# Patient Record
Sex: Female | Born: 1990 | Hispanic: Yes | Marital: Single | Smoking: Never smoker
Health system: Southern US, Community
[De-identification: ages and names within clinical notes are randomized; demographics above are authoritative.]

## PROBLEM LIST (undated history)

## (undated) ENCOUNTER — Inpatient Hospital Stay (HOSPITAL_COMMUNITY): Payer: Self-pay

## (undated) DIAGNOSIS — O24419 Gestational diabetes mellitus in pregnancy, unspecified control: Secondary | ICD-10-CM

## (undated) DIAGNOSIS — IMO0002 Reserved for concepts with insufficient information to code with codable children: Secondary | ICD-10-CM

## (undated) DIAGNOSIS — R87629 Unspecified abnormal cytological findings in specimens from vagina: Secondary | ICD-10-CM

## (undated) DIAGNOSIS — R87619 Unspecified abnormal cytological findings in specimens from cervix uteri: Secondary | ICD-10-CM

## (undated) HISTORY — PX: NO PAST SURGERIES: SHX2092

## (undated) HISTORY — DX: Gestational diabetes mellitus in pregnancy, unspecified control: O24.419

---
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
## 2011-02-09 NOTE — L&D Delivery Note (Signed)
Delivery Note At 12:42 PM a viable female was delivered via  (Presentation: ;  ).  APGAR: , ; weight .   Placenta status: , .  Cord:  with the following complications: .  Cord pH: not done  Anesthesia:   Episiotomy:  Lacerations:  Suture Repair: 2.0 vicryl Est. Blood Loss (mL):   Mom to postpartum.  Baby to nursery-stable.  Elaine Meyer A 01/25/2012, 12:55 PM

## 2011-05-31 ENCOUNTER — Inpatient Hospital Stay (HOSPITAL_COMMUNITY)
Admission: AD | Admit: 2011-05-31 | Discharge: 2011-05-31 | Disposition: A | Discharge status: Home / Self Care | Payer: Self-pay | Source: Ambulatory Visit | Attending: Obstetrics and Gynecology | Admitting: Obstetrics and Gynecology

## 2011-05-31 ENCOUNTER — Encounter (HOSPITAL_COMMUNITY): Payer: Self-pay | Admitting: *Deleted

## 2011-05-31 ENCOUNTER — Inpatient Hospital Stay (HOSPITAL_COMMUNITY): Payer: Self-pay

## 2011-05-31 DIAGNOSIS — O99891 Other specified diseases and conditions complicating pregnancy: Secondary | ICD-10-CM | POA: Insufficient documentation

## 2011-05-31 DIAGNOSIS — R109 Unspecified abdominal pain: Secondary | ICD-10-CM | POA: Insufficient documentation

## 2011-05-31 DIAGNOSIS — O26899 Other specified pregnancy related conditions, unspecified trimester: Secondary | ICD-10-CM

## 2011-05-31 LAB — CBC
MCH: 31.3 pg (ref 26.0–34.0)
MCHC: 33.4 g/dL (ref 30.0–36.0)
MCV: 93.7 fL (ref 78.0–100.0)
Platelets: 190 10*3/uL (ref 150–400)
RDW: 13.1 % (ref 11.5–15.5)
WBC: 8.5 10*3/uL (ref 4.0–10.5)

## 2011-05-31 LAB — URINALYSIS, ROUTINE W REFLEX MICROSCOPIC
Bilirubin Urine: NEGATIVE
Nitrite: NEGATIVE
Protein, ur: NEGATIVE mg/dL
Specific Gravity, Urine: 1.015 (ref 1.005–1.030)
Urobilinogen, UA: 0.2 mg/dL (ref 0.0–1.0)

## 2011-05-31 LAB — DIFFERENTIAL
Basophils Absolute: 0.1 10*3/uL (ref 0.0–0.1)
Basophils Relative: 1 % (ref 0–1)
Eosinophils Absolute: 0.5 10*3/uL (ref 0.0–0.7)
Eosinophils Relative: 5 % (ref 0–5)
Lymphocytes Relative: 26 % (ref 12–46)

## 2011-05-31 LAB — WET PREP, GENITAL: Yeast Wet Prep HPF POC: NONE SEEN

## 2011-05-31 LAB — HCG, QUANTITATIVE, PREGNANCY: hCG, Beta Chain, Quant, S: 45228 m[IU]/mL — ABNORMAL HIGH (ref <5)

## 2011-05-31 NOTE — Discharge Instructions (Signed)
Dolor abdominal en el embarazo  (Abdominal Pain During Pregnancy)  El dolor de vientre (abdominal) es habitual durante el embarazo. Generalmente no se trata de un problema grave. Otras veces puede ser un signo de que algo no anda bien. Siempre comunquese con su mdico si tiene dolor abdominal. CUIDADOS EN EL HOGAR  Si el dolor es leve:   No tenga sexo (relaciones sexuales) ni se coloque nada dentro de la vagina hasta que se sienta mejor.   Haga reposo hasta que el dolor se calme. Si el dolor dura ms de 1 hora, comunquese con su mdico.   Si siente ganas de vomitar (nuseas ) beba lquidos claros.   No consuma alimentos slidos hasta que se sienta mejor.   Slo tome los medicamentos que le indic el mdico.   Cumpla con los controles mdicos segn las indicaciones.  SOLICITE AYUDA DE INMEDIATO SI:   Tiene un sangrado, pierde lquido o elimina trozos de tejido por la vagina.   Siente ms dolor o clicos.   Comienza avomitar.   Siente dolor al orinar u observa sangre en la orina.   Tiene fiebre.   No siente mover al beb.   Se siente muy dbil o cree que va a desmayarse.   Tiene dificultad para respirar con o sin dolor en el vientre.   Siente un dolor de cabeza muy intenso y Engineer, mining en el vientre.   Observa que sale un lquido por la vagina y siente dolor abdominal.   La materia fecal es lquida (diarrea).   El dolor en el vientre no desaparece, o empeora, luego de hacer reposo.  ASEGRESE DE QUE:   Comprende estas instrucciones.   Controlar su enfermedad.   Solicitar ayuda de inmediato si no mejora o si empeora.  Document Released: 10/07/2010 Document Revised: 01/14/2011 [facility] Patient Information 2012 [place], [place].

## 2011-05-31 NOTE — MAU Note (Signed)
G1; denies pain at present but states that she has had intermittent, sharp, cramping pain since her last menses;pain is in lower abdomen;

## 2011-05-31 NOTE — MAU Provider Note (Signed)
History     CSN: [id number]  Arrival date and time: 05/31/11 1042   First Provider Initiated Contact with Patient 05/31/11 1155      Chief Complaint  Patient presents with  . Possible Pregnancy   HPI Elaine Meyer is 21 y.o. G1P0 Unknown weeks presenting with report of sharp abdominal pain.  Is unsure of pregnancy, our test is positive.  LMP 04/11/11, normal cycle for her.   Planned pregnancy.  + nausea no vomiting.  Pain began with LMP, mostly in the lower abdomen but sometime in the epigastric area.  Moved to [facility], will get care there.   +_ for breast tenderness and frequent urination.    No past medical history on file.  No past surgical history on file.  Family History  Problem Relation Age of Onset  . Alcohol abuse Neg Hx   . Arthritis Neg Hx   . Asthma Neg Hx   . Birth defects Neg Hx   . Cancer Neg Hx   . COPD Neg Hx   . Depression Neg Hx   . Diabetes Neg Hx   . Drug abuse Neg Hx   . Early death Neg Hx   . Hearing loss Neg Hx   . Heart disease Neg Hx   . Hyperlipidemia Neg Hx   . Hypertension Neg Hx   . Kidney disease Neg Hx   . Learning disabilities Neg Hx   . Mental illness Neg Hx   . Mental retardation Neg Hx   . Miscarriages / Stillbirths Neg Hx   . Stroke Neg Hx   . Vision loss Neg Hx     History  Substance Use Topics  . Smoking status: Never Smoker   . Smokeless tobacco: Not on file  . Alcohol Use: No    Allergies: No Known Allergies  Prescriptions prior to admission  Medication Sig Dispense Refill  . prenatal vitamin w/FE, FA (NATACHEW) 29-1 MG CHEW Chew 1 tablet by mouth daily.        Review of Systems  Constitutional: Negative for fever and chills.  Gastrointestinal: Positive for nausea and abdominal pain. Negative for vomiting.  Genitourinary:       Negative for bleeding or vaginal discharge   Physical Exam   Blood pressure 119/66, pulse 77, temperature 97.8 F (36.6 C), temperature source Oral, resp. rate 16, height  5' 3.5" (1.613 m), weight 56.7 kg (125 lb), last menstrual period 04/11/2011.  Physical Exam  Constitutional: She is oriented to person, place, and time. She appears well-developed and well-nourished. No distress.  HENT:  Head: Normocephalic.  Neck: Normal range of motion.  Cardiovascular: Normal rate.   Respiratory: Effort normal.  GI: Soft. She exhibits no distension and no mass. There is no tenderness. There is no rebound and no guarding.  Genitourinary: Uterus is enlarged (slightly). Uterus is not tender. Right adnexum displays no mass, no tenderness and no fullness. Left adnexum displays no mass, no tenderness and no fullness. No bleeding around the vagina. No vaginal discharge found.  Neurological: She is alert and oriented to person, place, and time.  Skin: Skin is dry.  Psychiatric: She has a normal mood and affect. Her behavior is normal. Judgment and thought content normal.   Results for orders placed during the hospital encounter of 05/31/11 (from the past 24 hour(s))  URINALYSIS, ROUTINE W REFLEX MICROSCOPIC     Status: Normal   Collection Time   05/31/11 11:05 AM      Component Value Range  Color, Urine YELLOW  YELLOW    APPearance CLEAR  CLEAR    Specific Gravity, Urine 1.015  1.005 - 1.030    pH 7.0  5.0 - 8.0    Glucose, UA NEGATIVE  NEGATIVE (mg/dL)   Hgb urine dipstick NEGATIVE  NEGATIVE    Bilirubin Urine NEGATIVE  NEGATIVE    Ketones, ur NEGATIVE  NEGATIVE (mg/dL)   Protein, ur NEGATIVE  NEGATIVE (mg/dL)   Urobilinogen, UA 0.2  0.0 - 1.0 (mg/dL)   Nitrite NEGATIVE  NEGATIVE    Leukocytes, UA NEGATIVE  NEGATIVE   POCT PREGNANCY, URINE     Status: Abnormal   Collection Time   05/31/11 11:15 AM      Component Value Range   Preg Test, Ur POSITIVE (*) NEGATIVE   CBC     Status: Abnormal   Collection Time   05/31/11 11:59 AM      Component Value Range   WBC 8.5  4.0 - 10.5 (K/uL)   RBC 3.80 (*) 3.87 - 5.11 (MIL/uL)   Hemoglobin 11.9 (*) 12.0 - 15.0 (g/dL)   HCT  16.1 (*) 09.6 - 46.0 (%)   MCV 93.7  78.0 - 100.0 (fL)   MCH 31.3  26.0 - 34.0 (pg)   MCHC 33.4  30.0 - 36.0 (g/dL)   RDW 04.5  40.9 - 81.1 (%)   Platelets 190  150 - 400 (K/uL)  DIFFERENTIAL     Status: Normal   Collection Time   05/31/11 11:59 AM      Component Value Range   Neutrophils Relative 60  43 - 77 (%)   Neutro Abs 5.1  1.7 - 7.7 (K/uL)   Lymphocytes Relative 26  12 - 46 (%)   Lymphs Abs 2.2  0.7 - 4.0 (K/uL)   Monocytes Relative 8  3 - 12 (%)   Monocytes Absolute 0.7  0.1 - 1.0 (K/uL)   Eosinophils Relative 5  0 - 5 (%)   Eosinophils Absolute 0.5  0.0 - 0.7 (K/uL)   Basophils Relative 1  0 - 1 (%)   Basophils Absolute 0.1  0.0 - 0.1 (K/uL)  HCG, QUANTITATIVE, PREGNANCY     Status: Abnormal   Collection Time   05/31/11 11:59 AM      Component Value Range   hCG, Beta Chain, Quant, S 91478 (*) <5 (mIU/mL)  WET PREP, GENITAL     Status: Abnormal   Collection Time   05/31/11 12:30 PM      Component Value Range   Yeast Wet Prep HPF POC NONE SEEN  NONE SEEN    Trich, Wet Prep NONE SEEN  NONE SEEN    Clue Cells Wet Prep HPF POC FEW (*) NONE SEEN    WBC, Wet Prep HPF POC MODERATE (*) NONE SEEN   ULTRASOUND REPORT:  Single IUP [redacted]w[redacted]d.  FHR 136.  EDD 01/16/12   MAU Course  Procedures  GC/CHL culture to lab  MDM   Assessment and Plan  A:  Abdominal pain in early pregnancy      IUP at [redacted]w[redacted]d by ultrasound  P:  Keep plans to begin prenatal care with doctor in [facility]. [name],EVE M 05/31/2011, 11:56 AM

## 2011-05-31 NOTE — MAU Note (Signed)
2 preg tests a wk ago, both were positive.  Having abd pain, comes and goes, upper and lower.

## 2011-06-01 NOTE — MAU Provider Note (Signed)
Agree with above note.  Elaine Meyer 06/01/2011 6:55 AM

## 2011-10-04 LAB — OB RESULTS CONSOLE RUBELLA ANTIBODY, IGM: Rubella: IMMUNE

## 2012-01-22 ENCOUNTER — Encounter (HOSPITAL_COMMUNITY): Payer: Self-pay | Admitting: *Deleted

## 2012-01-22 ENCOUNTER — Inpatient Hospital Stay (HOSPITAL_COMMUNITY)
Admission: AD | Admit: 2012-01-22 | Discharge: 2012-01-22 | Disposition: A | Discharge status: Home / Self Care | Payer: Self-pay | Source: Ambulatory Visit | Attending: Obstetrics | Admitting: Obstetrics

## 2012-01-22 DIAGNOSIS — O99891 Other specified diseases and conditions complicating pregnancy: Secondary | ICD-10-CM | POA: Insufficient documentation

## 2012-01-22 DIAGNOSIS — O479 False labor, unspecified: Secondary | ICD-10-CM

## 2012-01-22 DIAGNOSIS — N949 Unspecified condition associated with female genital organs and menstrual cycle: Secondary | ICD-10-CM | POA: Insufficient documentation

## 2012-01-22 HISTORY — DX: Unspecified abnormal cytological findings in specimens from cervix uteri: R87.619

## 2012-01-22 HISTORY — DX: Reserved for concepts with insufficient information to code with codable children: IMO0002

## 2012-01-22 LAB — AMNISURE RUPTURE OF MEMBRANE (ROM) NOT AT ARMC: Amnisure ROM: NEGATIVE

## 2012-01-22 NOTE — MAU Provider Note (Signed)
HPI: Elaine Meyer is a 21 y.o. year old G1P0 female at [redacted]w[redacted]d weeks gestation who presents to MAU reporting leaking small amount of clear fluid mixed w/ brow mucus and cramping. Pos FM.  FHR 120, category I Toco: Q 5-10 min, mild  Pelvic: Pool equivocal. Small amount of clear fluid mixed w/ moderate amount of tan mucus. Dilation: 1 Effacement (%): 70 Cervical Position: Posterior Station: -3 Presentation: Vertex Exam by:: [place] CNM   Recent Results (from the past 168 hour(s))  AMNISURE RUPTURE OF MEMBRANE (ROM)   Collection Time   01/22/12  3:15 PM      Component Value Range   Amnisure ROM NEGATIVE     Assessment: Intact membranes  Plan: D/C home Labor precautions and FKCs F/U w/ Dr. Gaynell Face as scheduled.  Dorathy Kinsman, CNM

## 2012-01-22 NOTE — MAU Note (Signed)
Started with pinkish, thick discharge, then became watery.  No ctx's, no obvious bleeding.

## 2012-01-22 NOTE — MAU Note (Signed)
Did not wear pad in, underwear dry

## 2012-01-24 ENCOUNTER — Encounter (HOSPITAL_COMMUNITY): Payer: Self-pay

## 2012-01-24 ENCOUNTER — Inpatient Hospital Stay (HOSPITAL_COMMUNITY)
Admission: RE | Admit: 2012-01-24 | Discharge: 2012-01-27 | DRG: 775 | Disposition: A | Discharge status: Home / Self Care | Payer: Medicaid Other | Source: Ambulatory Visit | Attending: Obstetrics | Admitting: Obstetrics

## 2012-01-24 LAB — TYPE AND SCREEN
ABO/RH(D): O POS
Antibody Screen: NEGATIVE

## 2012-01-24 LAB — CBC
MCHC: 32.3 g/dL (ref 30.0–36.0)
RDW: 16.1 % — ABNORMAL HIGH (ref 11.5–15.5)

## 2012-01-24 LAB — ABO/RH: ABO/RH(D): O POS

## 2012-01-24 MED ORDER — ZOLPIDEM TARTRATE 5 MG PO TABS
5.0000 mg | ORAL_TABLET | Freq: Every evening | ORAL | Status: DC | PRN
  Administered 2012-01-24: 5 mg via ORAL
  Filled 2012-01-24: qty 1

## 2012-01-24 MED ORDER — IBUPROFEN 600 MG PO TABS
600.0000 mg | ORAL_TABLET | Freq: Four times a day (QID) | ORAL | Status: DC | PRN
  Filled 2012-01-24 (×4): qty 1

## 2012-01-24 MED ORDER — ACETAMINOPHEN 325 MG PO TABS: 650.0000 mg | ORAL_TABLET | ORAL | Status: DC | PRN

## 2012-01-24 MED ORDER — OXYTOCIN 40 UNITS IN LACTATED RINGERS INFUSION - SIMPLE MED
62.5000 mL/h | INTRAVENOUS | Status: DC
  Filled 2012-01-24: qty 1000

## 2012-01-24 MED ORDER — TERBUTALINE SULFATE 1 MG/ML IJ SOLN: 0.2500 mg | Freq: Once | INTRAMUSCULAR | Status: AC | PRN

## 2012-01-24 MED ORDER — OXYTOCIN 40 UNITS IN LACTATED RINGERS INFUSION - SIMPLE MED: 1.0000 m[IU]/min | INTRAVENOUS | Status: DC

## 2012-01-24 MED ORDER — OXYCODONE-ACETAMINOPHEN 5-325 MG PO TABS
1.0000 | ORAL_TABLET | ORAL | Status: DC | PRN
  Filled 2012-01-24: qty 1

## 2012-01-24 MED ORDER — FLEET ENEMA 7-19 GM/118ML RE ENEM: 1.0000 | ENEMA | RECTAL | Status: DC | PRN

## 2012-01-24 MED ORDER — ONDANSETRON HCL 4 MG/2ML IJ SOLN: 4.0000 mg | Freq: Four times a day (QID) | INTRAMUSCULAR | Status: DC | PRN

## 2012-01-24 MED ORDER — OXYTOCIN BOLUS FROM INFUSION: 500.0000 mL | INTRAVENOUS | Status: DC

## 2012-01-24 MED ORDER — LIDOCAINE HCL (PF) 1 % IJ SOLN: 30.0000 mL | INTRAMUSCULAR | Status: DC | PRN

## 2012-01-24 MED ORDER — CITRIC ACID-SODIUM CITRATE 334-500 MG/5ML PO SOLN: 30.0000 mL | ORAL | Status: DC | PRN

## 2012-01-24 MED ORDER — LACTATED RINGERS IV SOLN
INTRAVENOUS | Status: DC
  Administered 2012-01-24 – 2012-01-25 (×2): via INTRAVENOUS

## 2012-01-24 MED ORDER — LACTATED RINGERS IV SOLN
500.0000 mL | INTRAVENOUS | Status: DC | PRN
  Administered 2012-01-25: 500 mL via INTRAVENOUS

## 2012-01-25 ENCOUNTER — Inpatient Hospital Stay (HOSPITAL_COMMUNITY): Payer: Medicaid Other | Admitting: Anesthesiology

## 2012-01-25 ENCOUNTER — Encounter (HOSPITAL_COMMUNITY): Payer: Self-pay | Admitting: Anesthesiology

## 2012-01-25 ENCOUNTER — Encounter (HOSPITAL_COMMUNITY): Payer: Self-pay

## 2012-01-25 LAB — RPR: RPR Ser Ql: NONREACTIVE

## 2012-01-25 MED ORDER — PRENATAL MULTIVITAMIN CH
1.0000 | ORAL_TABLET | Freq: Every day | ORAL | Status: DC
  Administered 2012-01-26 – 2012-01-27 (×2): 1 via ORAL
  Filled 2012-01-25 (×2): qty 1

## 2012-01-25 MED ORDER — IBUPROFEN 600 MG PO TABS
600.0000 mg | ORAL_TABLET | Freq: Four times a day (QID) | ORAL | Status: DC
  Administered 2012-01-25 – 2012-01-26 (×4): 600 mg via ORAL
  Filled 2012-01-25 (×2): qty 1

## 2012-01-25 MED ORDER — OXYCODONE-ACETAMINOPHEN 5-325 MG PO TABS
1.0000 | ORAL_TABLET | ORAL | Status: DC | PRN
  Administered 2012-01-25: 1 via ORAL

## 2012-01-25 MED ORDER — ONDANSETRON HCL 4 MG PO TABS: 4.0000 mg | ORAL_TABLET | ORAL | Status: DC | PRN

## 2012-01-25 MED ORDER — ZOLPIDEM TARTRATE 5 MG PO TABS: 5.0000 mg | ORAL_TABLET | Freq: Every evening | ORAL | Status: DC | PRN

## 2012-01-25 MED ORDER — LANOLIN HYDROUS EX OINT: TOPICAL_OINTMENT | CUTANEOUS | Status: DC | PRN

## 2012-01-25 MED ORDER — WITCH HAZEL-GLYCERIN EX PADS: 1.0000 "application " | MEDICATED_PAD | CUTANEOUS | Status: DC | PRN

## 2012-01-25 MED ORDER — DIPHENHYDRAMINE HCL 50 MG/ML IJ SOLN: 12.5000 mg | INTRAMUSCULAR | Status: DC | PRN

## 2012-01-25 MED ORDER — SENNOSIDES-DOCUSATE SODIUM 8.6-50 MG PO TABS
2.0000 | ORAL_TABLET | Freq: Every day | ORAL | Status: DC
  Administered 2012-01-25 – 2012-01-26 (×2): 2 via ORAL

## 2012-01-25 MED ORDER — ONDANSETRON HCL 4 MG/2ML IJ SOLN: 4.0000 mg | INTRAMUSCULAR | Status: DC | PRN

## 2012-01-25 MED ORDER — PHENYLEPHRINE 40 MCG/ML (10ML) SYRINGE FOR IV PUSH (FOR BLOOD PRESSURE SUPPORT)
80.0000 ug | PREFILLED_SYRINGE | INTRAVENOUS | Status: DC | PRN
  Filled 2012-01-25: qty 5

## 2012-01-25 MED ORDER — DIBUCAINE 1 % RE OINT: 1.0000 "application " | TOPICAL_OINTMENT | RECTAL | Status: DC | PRN

## 2012-01-25 MED ORDER — FERROUS SULFATE 325 (65 FE) MG PO TABS
325.0000 mg | ORAL_TABLET | Freq: Two times a day (BID) | ORAL | Status: DC
  Administered 2012-01-26 – 2012-01-27 (×3): 325 mg via ORAL
  Filled 2012-01-25 (×3): qty 1

## 2012-01-25 MED ORDER — BENZOCAINE-MENTHOL 20-0.5 % EX AERO: 1.0000 "application " | INHALATION_SPRAY | CUTANEOUS | Status: DC | PRN

## 2012-01-25 MED ORDER — LACTATED RINGERS IV SOLN
500.0000 mL | Freq: Once | INTRAVENOUS | Status: AC
  Administered 2012-01-25: 500 mL via INTRAVENOUS

## 2012-01-25 MED ORDER — DIPHENHYDRAMINE HCL 25 MG PO CAPS: 25.0000 mg | ORAL_CAPSULE | Freq: Four times a day (QID) | ORAL | Status: DC | PRN

## 2012-01-25 MED ORDER — BUTORPHANOL TARTRATE 1 MG/ML IJ SOLN
2.0000 mg | INTRAMUSCULAR | Status: DC | PRN
  Administered 2012-01-25: 2 mg via INTRAVENOUS
  Filled 2012-01-25: qty 2

## 2012-01-25 MED ORDER — PHENYLEPHRINE 40 MCG/ML (10ML) SYRINGE FOR IV PUSH (FOR BLOOD PRESSURE SUPPORT): 80.0000 ug | PREFILLED_SYRINGE | INTRAVENOUS | Status: DC | PRN

## 2012-01-25 MED ORDER — SIMETHICONE 80 MG PO CHEW: 80.0000 mg | CHEWABLE_TABLET | ORAL | Status: DC | PRN

## 2012-01-25 MED ORDER — FENTANYL 2.5 MCG/ML BUPIVACAINE 1/10 % EPIDURAL INFUSION (WH - ANES)
14.0000 mL/h | INTRAMUSCULAR | Status: DC
  Administered 2012-01-25 (×2): 14 mL/h via EPIDURAL
  Filled 2012-01-25 (×2): qty 125

## 2012-01-25 MED ORDER — LIDOCAINE HCL (PF) 1 % IJ SOLN
INTRAMUSCULAR | Status: DC | PRN
  Administered 2012-01-25: 2 mL
  Administered 2012-01-25 (×3): 4 mL

## 2012-01-25 MED ORDER — TETANUS-DIPHTH-ACELL PERTUSSIS 5-2.5-18.5 LF-MCG/0.5 IM SUSP
0.5000 mL | Freq: Once | INTRAMUSCULAR | Status: AC
  Administered 2012-01-26: 0.5 mL via INTRAMUSCULAR

## 2012-01-25 MED ORDER — EPHEDRINE 5 MG/ML INJ: 10.0000 mg | INTRAVENOUS | Status: DC | PRN

## 2012-01-25 MED ORDER — EPHEDRINE 5 MG/ML INJ
10.0000 mg | INTRAVENOUS | Status: DC | PRN
  Filled 2012-01-25: qty 4

## 2012-01-25 NOTE — H&P (Signed)
This is Dr. Francoise Ceo dictating the history and physical on  Elaine Meyer she's a 21 year old gravida 1 at 1 weeks and 2 days [facility] 01/16/2012 was admitted for induction negative GBS her cervix is 2 cm 90% the vertex 0 station him she was started on low-dose Pitocin and she is now 9 cm 100% vertex -2 forewaters were ruptured fluids clear Past medical history negative Past surgical history negative Social history noncontributory System review negative Physical exam revealed a well-developed female in labor HEENT negative Lungs clear to P&A Breasts engorged Heart regular rhythm no murmurs no gallops Abdomen term Pelvic as described above Extremities negative and

## 2012-01-25 NOTE — Anesthesia Procedure Notes (Signed)
Epidural Patient location during procedure: OB Start time: 01/25/2012 4:35 AM  Staffing Performed by: anesthesiologist   Preanesthetic Checklist Completed: patient identified, site marked, surgical consent, pre-op evaluation, timeout performed, IV checked, risks and benefits discussed and monitors and equipment checked  Epidural Patient position: sitting Prep: site prepped and draped and DuraPrep Patient monitoring: continuous pulse ox and blood pressure Approach: midline Injection technique: LOR air  Needle:  Needle type: Tuohy  Needle gauge: 17 G Needle length: 9 cm and 9 Needle insertion depth: 5 cm cm Catheter type: closed end flexible Catheter size: 19 Gauge Catheter at skin depth: 10 cm Test dose: negative  Assessment Events: blood not aspirated, injection not painful, no injection resistance, negative IV test and no paresthesia  Additional Notes Discussed risk of headache, infection, bleeding, nerve injury and failed or incomplete block.  Patient voices understanding and wishes to proceed.  Epidural placed easily on first attempt.  Patient tolerated procedure well with no apparent complications.  Jasmine December, MD Reason for block:procedure for pain

## 2012-01-25 NOTE — Anesthesia Preprocedure Evaluation (Signed)
Anesthesia Evaluation  Patient identified by MRN, date of birth, ID band Patient awake    Reviewed: Allergy & Precautions, H&P , NPO status , Patient's Chart, lab work & pertinent test results, reviewed documented beta blocker date and time   History of Anesthesia Complications Negative for: history of anesthetic complications  Airway Mallampati: I TM Distance: >3 FB Neck ROM: full    Dental  (+) Teeth Intact   Pulmonary neg pulmonary ROS,  breath sounds clear to auscultation        Cardiovascular negative cardio ROS  Rhythm:regular Rate:Normal     Neuro/Psych negative neurological ROS  negative psych ROS   GI/Hepatic negative GI ROS, Neg liver ROS,   Endo/Other  negative endocrine ROS  Renal/GU negative Renal ROS     Musculoskeletal   Abdominal   Peds  Hematology  (+) Blood dyscrasia (hgb 9.8), anemia ,   Anesthesia Other Findings   Reproductive/Obstetrics (+) Pregnancy                           Anesthesia Physical  Anesthesia Plan  ASA: II  Anesthesia Plan: Epidural   Post-op Pain Management:    Induction:   Airway Management Planned:   Additional Equipment:   Intra-op Plan:   Post-operative Plan:   Informed Consent: I have reviewed the patients History and Physical, chart, labs and discussed the procedure including the risks, benefits and alternatives for the proposed anesthesia with the patient or authorized representative who has indicated his/her understanding and acceptance.     Plan Discussed with:   Anesthesia Plan Comments:         Anesthesia Quick Evaluation  

## 2012-01-25 NOTE — Progress Notes (Signed)
Patient ID: Elaine Meyer, female   DOB: [birth year], 21 y.o.   MRN: [id number]  now fully dilated plus one station reactive tracing

## 2012-01-26 LAB — CBC
MCH: 26.2 pg (ref 26.0–34.0)
MCHC: 31.5 g/dL (ref 30.0–36.0)
MCV: 83.2 fL (ref 78.0–100.0)
Platelets: 171 10*3/uL (ref 150–400)
RBC: 3.09 MIL/uL — ABNORMAL LOW (ref 3.87–5.11)
RDW: 16 % — ABNORMAL HIGH (ref 11.5–15.5)

## 2012-01-26 MED ORDER — INFLUENZA VIRUS VACC SPLIT PF IM SUSP
0.5000 mL | Freq: Once | INTRAMUSCULAR | Status: AC
  Administered 2012-01-26: 0.5 mL via INTRAMUSCULAR
  Filled 2012-01-26: qty 0.5

## 2012-01-26 NOTE — Progress Notes (Signed)
Patient ID: Elaine Meyer, female   DOB: November 17, 1990, 21 y.o.   MRN: [id number] Postpartum day one  Vital signs normal Fundus firm Lochia moderate Legs negative Doing well

## 2012-01-26 NOTE — Progress Notes (Signed)
UR chart review completed.  

## 2012-01-27 NOTE — Discharge Summary (Signed)
Obstetric Discharge Summary Reason for Admission: induction of labor Prenatal Procedures: none Intrapartum Procedures: spontaneous vaginal delivery Postpartum Procedures: none Complications-Operative and Postpartum: none Hemoglobin  Date Value Range Status  01/26/2012 8.1* 12.0 - 15.0 g/dL Final     DELTA CHECK NOTED     REPEATED TO VERIFY     HCT  Date Value Range Status  01/26/2012 25.7* 36.0 - 46.0 % Final    Physical Exam:  General: alert Lochia: appropriate Uterine Fundus: firm Incision: healing well DVT Evaluation: No evidence of DVT seen on physical exam.  Discharge Diagnoses: Term Pregnancy-delivered  Discharge Information: Date: 01/27/2012 Activity: pelvic rest Diet: routine Medications: Percocet Condition: stable Instructions: refer to practice specific booklet Discharge to: home Follow-up Information    Call in 6 weeks to follow up.         Newborn Data: Live born female  Birth Weight: 7 lb 11.5 oz (3500 g) APGAR: 9, 9  Home with mother.  Elaine Meyer A 01/27/2012, 6:21 AM

## 2012-01-29 NOTE — Anesthesia Postprocedure Evaluation (Signed)
Patient stable following vaginal delivery.  

## 2012-03-28 LAB — HM PAP SMEAR: HM Pap smear: POSITIVE

## 2012-05-16 ENCOUNTER — Emergency Department (HOSPITAL_COMMUNITY)
Admission: EM | Admit: 2012-05-16 | Discharge: 2012-05-17 | Disposition: A | Discharge status: Home / Self Care | Payer: Self-pay | Attending: Emergency Medicine | Admitting: Emergency Medicine

## 2012-05-16 ENCOUNTER — Encounter (HOSPITAL_COMMUNITY): Payer: Self-pay | Admitting: Emergency Medicine

## 2012-05-16 DIAGNOSIS — R509 Fever, unspecified: Secondary | ICD-10-CM | POA: Insufficient documentation

## 2012-05-16 DIAGNOSIS — Z8619 Personal history of other infectious and parasitic diseases: Secondary | ICD-10-CM | POA: Insufficient documentation

## 2012-05-16 DIAGNOSIS — R5381 Other malaise: Secondary | ICD-10-CM | POA: Insufficient documentation

## 2012-05-16 DIAGNOSIS — R5383 Other fatigue: Secondary | ICD-10-CM | POA: Insufficient documentation

## 2012-05-16 DIAGNOSIS — N61 Mastitis without abscess: Secondary | ICD-10-CM | POA: Insufficient documentation

## 2012-05-16 NOTE — ED Notes (Signed)
This nurse present for physical exam and left breast ultrasound performed by Theron Arista, [place]

## 2012-05-16 NOTE — ED Notes (Addendum)
Pt c/o fever and body aches x2 days. Denies NVD, SOB, cough. Pt c/o left breast tenderness. Pt reports upon pumping today, pt noticed left breast milk was pink in color. Pt denies issues with right breast. Left breast is hardened and tender on palpation.

## 2012-05-16 NOTE — ED Notes (Signed)
PT. REPORTS FEVER 2 DAYS AGO WITH BODY ACHES ALSO REPORTS " PINK COLORED  BREAST MILK" YESTERDAY WITH LEFT BREAST SORENESS.

## 2012-05-17 MED ORDER — CEPHALEXIN 500 MG PO CAPS: 500.0000 mg | ORAL_CAPSULE | Freq: Four times a day (QID) | ORAL | Status: DC

## 2012-05-17 NOTE — ED Notes (Signed)
Pt comfortable with discharge and follow-up instructions. Given prescriptions x1

## 2012-05-17 NOTE — ED Provider Notes (Signed)
History     CSN: [id number]  Arrival date & time 05/16/12  2203   First MD Initiated Contact with Patient 05/16/12 2331      Chief Complaint  Patient presents with  . Fever  . Breast Problem   HPI  History provided by the patient and significant other. Patient is a 22 year old female currently breast-feeding her 61-month-old daughter presenting with complaints of pain, redness and pink milky discharge from left breast and nipple. Symptoms first began with some redness and tenderness to the outside of the left breast 2 days ago. Area has become more red and tender since that time. Today patient noticed pink milky discharge from the left nipple the breasts. She also reports some subjective chills and fatigue with slight fevers at home. She is not use any medications or treatment for symptoms. Denies having similar symptoms previously. No other aggravating or alleviating factors. No other associated symptoms.    Past Medical History  Diagnosis Date  . Abnormal Pap smear     HPV    Past Surgical History  Procedure Laterality Date  . No past surgeries      Family History  Problem Relation Age of Onset  . Alcohol abuse Neg Hx   . Arthritis Neg Hx   . Asthma Neg Hx   . Birth defects Neg Hx   . Cancer Neg Hx   . COPD Neg Hx   . Depression Neg Hx   . Diabetes Neg Hx   . Drug abuse Neg Hx   . Early death Neg Hx   . Hearing loss Neg Hx   . Heart disease Neg Hx   . Hyperlipidemia Neg Hx   . Hypertension Neg Hx   . Kidney disease Neg Hx   . Learning disabilities Neg Hx   . Mental illness Neg Hx   . Mental retardation Neg Hx   . Miscarriages / Stillbirths Neg Hx   . Stroke Neg Hx   . Vision loss Neg Hx   . Other Neg Hx     History  Substance Use Topics  . Smoking status: Never Smoker   . Smokeless tobacco: Never Used  . Alcohol Use: No    OB History   Grav Para Term Preterm Abortions TAB SAB Ect Mult Living   1 1 1       1       Review of Systems  Constitutional:  Positive for chills and fatigue.  Skin:       Breast redness and pain  All other systems reviewed and are negative.    Allergies  Review of patient's allergies indicates no known allergies.  Home Medications  No current outpatient prescriptions on file.  BP 127/52  Pulse 83  Temp(Src) 98 F (36.7 C) (Oral)  Resp 14  SpO2 98%  LMP 05/01/2012  Physical Exam  Nursing note and vitals reviewed. Constitutional: She is oriented to person, place, and time. She appears well-developed and well-nourished. No distress.  HENT:  Head: Normocephalic.  Cardiovascular: Normal rate and regular rhythm.   Pulmonary/Chest: Effort normal and breath sounds normal. No respiratory distress.  Mild erythema and tenderness to left inferolateral breast.  Small amount of milky discharge from nipple without obvious blood. Bedside ultrasound performed without any signs of superficial abscess  Abdominal: Soft.  Neurological: She is alert and oriented to person, place, and time.  Skin: Skin is warm and dry. No rash noted.  Psychiatric: She has a normal mood and affect. Her behavior  is normal.    ED Course  Procedures    1. Mastitis, left, acute       MDM  11:50PM patient seen and evaluated. Patient well-appearing in no acute distress. Patient does not appear severely ill or toxic. She is afebrile at triage. No prior medications.        Angus Seller, PA-C 05/17/12 0011

## 2012-05-18 NOTE — ED Provider Notes (Signed)
Medical screening examination/treatment/procedure(s) were performed by non-physician practitioner and as supervising physician I was immediately available for consultation/collaboration.  Jones Skene, M.D.     Jones Skene, MD 05/18/12 [phone]

## 2013-02-08 NOTE — L&D Delivery Note (Signed)
Delivery Note At 4:25 AM a viable female was delivered via Vaginal, Spontaneous Delivery (Presentation: Left Occiput Anterior).  APGAR: 7, 9; weight pending.   Placenta status: Intact, Spontaneous.  Cord:  3v with the following complications: none.  Cord pH:   Anesthesia: Epidural  Episiotomy: None Lacerations: None Suture Repair: none Est. Blood Loss (mL): 150  Mom to postpartum.  Baby to Couplet care / Skin to Skin.  Tawni CarnesWight, Andrew 11/11/2013, 4:44 AM     I was gloved and present for delivery in its entirety.  Second stage of labor progressed, baby delivered after few contractions.  no decels during second stage noted.  Complications: none  Lacerations: none  EBL: 150  Jeramie Scogin [name], MD 8:53 AM

## 2013-05-21 ENCOUNTER — Other Ambulatory Visit (HOSPITAL_COMMUNITY): Payer: Self-pay | Admitting: Physician Assistant

## 2013-05-21 DIAGNOSIS — Z3689 Encounter for other specified antenatal screening: Secondary | ICD-10-CM

## 2013-05-21 LAB — OB RESULTS CONSOLE ABO/RH: RH Type: POSITIVE

## 2013-05-21 LAB — OB RESULTS CONSOLE ANTIBODY SCREEN: Antibody Screen: NEGATIVE

## 2013-05-21 LAB — OB RESULTS CONSOLE HIV ANTIBODY (ROUTINE TESTING): HIV: NONREACTIVE

## 2013-05-21 LAB — OB RESULTS CONSOLE HEPATITIS B SURFACE ANTIGEN: Hepatitis B Surface Ag: NEGATIVE

## 2013-05-21 LAB — OB RESULTS CONSOLE RUBELLA ANTIBODY, IGM: RUBELLA: IMMUNE

## 2013-05-21 LAB — OB RESULTS CONSOLE GC/CHLAMYDIA
Chlamydia: NEGATIVE
Gonorrhea: NEGATIVE

## 2013-05-21 LAB — OB RESULTS CONSOLE RPR: RPR: NONREACTIVE

## 2013-05-27 ENCOUNTER — Inpatient Hospital Stay (HOSPITAL_COMMUNITY)
Admission: AD | Admit: 2013-05-27 | Discharge: 2013-05-28 | Disposition: A | Discharge status: Home / Self Care | Payer: Medicaid Other | Source: Ambulatory Visit | Attending: Obstetrics & Gynecology | Admitting: Obstetrics & Gynecology

## 2013-05-27 ENCOUNTER — Encounter (HOSPITAL_COMMUNITY): Payer: Self-pay | Admitting: *Deleted

## 2013-05-27 ENCOUNTER — Inpatient Hospital Stay (HOSPITAL_COMMUNITY): Payer: Medicaid Other

## 2013-05-27 DIAGNOSIS — O208 Other hemorrhage in early pregnancy: Secondary | ICD-10-CM | POA: Diagnosis not present

## 2013-05-27 DIAGNOSIS — O209 Hemorrhage in early pregnancy, unspecified: Secondary | ICD-10-CM | POA: Diagnosis present

## 2013-05-27 DIAGNOSIS — O4690 Antepartum hemorrhage, unspecified, unspecified trimester: Secondary | ICD-10-CM

## 2013-05-27 HISTORY — DX: Unspecified abnormal cytological findings in specimens from vagina: R87.629

## 2013-05-27 LAB — URINALYSIS, ROUTINE W REFLEX MICROSCOPIC
Bilirubin Urine: NEGATIVE
GLUCOSE, UA: NEGATIVE mg/dL
Ketones, ur: 15 mg/dL — AB
Leukocytes, UA: NEGATIVE
Nitrite: NEGATIVE
PH: 6 (ref 5.0–8.0)
Protein, ur: NEGATIVE mg/dL
SPECIFIC GRAVITY, URINE: 1.025 (ref 1.005–1.030)
Urobilinogen, UA: 0.2 mg/dL (ref 0.0–1.0)

## 2013-05-27 LAB — URINE MICROSCOPIC-ADD ON

## 2013-05-27 LAB — CBC
HEMATOCRIT: 31.7 % — AB (ref 36.0–46.0)
Hemoglobin: 10.8 g/dL — ABNORMAL LOW (ref 12.0–15.0)
MCH: 30.6 pg (ref 26.0–34.0)
MCHC: 34.1 g/dL (ref 30.0–36.0)
MCV: 89.8 fL (ref 78.0–100.0)
Platelets: 196 10*3/uL (ref 150–400)
RBC: 3.53 MIL/uL — ABNORMAL LOW (ref 3.87–5.11)
RDW: 14.2 % (ref 11.5–15.5)
WBC: 9.8 10*3/uL (ref 4.0–10.5)

## 2013-05-27 NOTE — Progress Notes (Signed)
Patient informed to keep appointment for anatomy/dating US tomorrow, 05/28/13 at 2:00 pm.

## 2013-05-27 NOTE — MAU Provider Note (Signed)
History     CSN: [id number]  Arrival date and time: 05/27/13 2145   First Provider Initiated Contact with Patient 05/27/13 2225      Chief Complaint  Patient presents with  . Vaginal Bleeding   Vaginal Bleeding Pertinent negatives include no abdominal pain.    Pt is a 23 yo G2P1001 at 10278w3d wks IUP here with report of blood with wiping x 3 today. No report of abdominal pain or cramping.  Denies intercourse in past 24 hours.  Negative UTI symptoms.  [facility]  Anatomy scan scheduled for tomorrow.    Past Medical History  Diagnosis Date  . Abnormal Pap smear     HPV  . Vaginal Pap smear, abnormal     Past Surgical History  Procedure Laterality Date  . No past surgeries      Family History  Problem Relation Age of Onset  . Alcohol abuse Neg Hx   . Arthritis Neg Hx   . Asthma Neg Hx   . Birth defects Neg Hx   . Cancer Neg Hx   . COPD Neg Hx   . Depression Neg Hx   . Diabetes Neg Hx   . Drug abuse Neg Hx   . Early death Neg Hx   . Hearing loss Neg Hx   . Heart disease Neg Hx   . Hyperlipidemia Neg Hx   . Hypertension Neg Hx   . Kidney disease Neg Hx   . Learning disabilities Neg Hx   . Mental illness Neg Hx   . Mental retardation Neg Hx   . Miscarriages / Stillbirths Neg Hx   . Stroke Neg Hx   . Vision loss Neg Hx   . Other Neg Hx     History  Substance Use Topics  . Smoking status: Never Smoker   . Smokeless tobacco: Never Used  . Alcohol Use: No    Allergies: No Known Allergies  Prescriptions prior to admission  Medication Sig Dispense Refill  . Prenatal Vit-Fe Fumarate-FA (PRENATAL MULTIVITAMIN) TABS tablet Take 1 tablet by mouth daily at 12 noon.        Review of Systems  Gastrointestinal: Negative for abdominal pain.  Genitourinary: Positive for vaginal bleeding.       Vaginal bleeding  All other systems reviewed and are negative.  Physical Exam   Blood pressure 104/74, pulse 98,  temperature 98.7 F (37.1 C), temperature source Oral, resp. rate 18, height 5\' 3"  (1.6 m), weight 59.138 kg (130 lb 6 oz), SpO2 100.00%.  Physical Exam  Constitutional: She is oriented to person, place, and time. She appears well-developed and well-nourished. No distress.  HENT:  Head: Normocephalic.  Neck: Normal range of motion. Neck supple.  Cardiovascular: Normal rate, regular rhythm and normal heart sounds.   Respiratory: Effort normal and breath sounds normal.  GI: Soft. There is no tenderness.  Genitourinary: There is bleeding (moderate) around the vagina.  Musculoskeletal: Normal range of motion. She exhibits no edema.  Neurological: She is alert and oriented to person, place, and time.  Skin: Skin is warm and dry.    MAU Course  Procedures  Results for orders placed during the hospital encounter of 05/27/13 (from the past 24 hour(s))  URINALYSIS, ROUTINE W REFLEX MICROSCOPIC     Status: Abnormal   Collection Time    05/27/13 10:00 PM      Result Value Ref Range   Color, Urine YELLOW  YELLOW   APPearance  CLEAR  CLEAR   Specific Gravity, Urine 1.025  1.005 - 1.030   pH 6.0  5.0 - 8.0   Glucose, UA NEGATIVE  NEGATIVE mg/dL   Hgb urine dipstick LARGE (*) NEGATIVE   Bilirubin Urine NEGATIVE  NEGATIVE   Ketones, ur 15 (*) NEGATIVE mg/dL   Protein, ur NEGATIVE  NEGATIVE mg/dL   Urobilinogen, UA 0.2  0.0 - 1.0 mg/dL   Nitrite NEGATIVE  NEGATIVE   Leukocytes, UA NEGATIVE  NEGATIVE  URINE MICROSCOPIC-ADD ON     Status: Abnormal   Collection Time    05/27/13 10:00 PM      Result Value Ref Range   Squamous Epithelial / LPF FEW (*) RARE   WBC, UA 0-2  <3 WBC/hpf   RBC / HPF 3-6  <3 RBC/hpf   Bacteria, UA FEW (*) RARE   Urine-Other MUCOUS PRESENT    CBC     Status: Abnormal   Collection Time    05/27/13 10:35 PM      Result Value Ref Range   WBC 9.8  4.0 - 10.5 K/uL   RBC 3.53 (*) 3.87 - 5.11 MIL/uL   Hemoglobin 10.8 (*) 12.0 - 15.0 g/dL   HCT 16.131.7 (*) 09.636.0 - 04.546.0 %    MCV 89.8  78.0 - 100.0 fL   MCH 30.6  26.0 - 34.0 pg   MCHC 34.1  30.0 - 36.0 g/dL   RDW 40.914.2  81.111.5 - 91.415.5 %   Platelets 196  150 - 400 K/uL   Ultrasound: Anterior, above cervical os placent Subchorionic hemorrhage (5.7 x 1.0 x 1.7 cm), cervical length 3.8 cm  Assessment and Plan  22 yo G2P1001 at 2267w4d wks IUP Subchorionic Hemorrhage  Plan: Discharge to home RX [employer] Bleeding precautions Keep scheduled appointment for anatomy scan tomorrow. Consulted with Dr. Erin FullingHarraway-Smith > agrees with plan of care  Melissa NoonWalidah N Muhammad, CNM 05/28/2013 0100

## 2013-05-27 NOTE — MAU Note (Signed)
Pt reports just becoming aware of fetal movement, denies any pain.  [facility] and has her ultrasound tomorrow.

## 2013-05-27 NOTE — MAU Note (Signed)
Patient states she saw some vaginal bleeding while using the bathroom earlier this afternoon and then again prior to coming to the hospital. Denies pain, LOF and vaginal discharge.

## 2013-05-28 ENCOUNTER — Ambulatory Visit (HOSPITAL_COMMUNITY)
Admission: RE | Admit: 2013-05-28 | Discharge: 2013-05-28 | Disposition: A | Discharge status: Home / Self Care | Payer: Medicaid Other | Source: Ambulatory Visit | Attending: Physician Assistant | Admitting: Physician Assistant

## 2013-05-28 DIAGNOSIS — Z3689 Encounter for other specified antenatal screening: Secondary | ICD-10-CM | POA: Diagnosis present

## 2013-05-28 DIAGNOSIS — O4690 Antepartum hemorrhage, unspecified, unspecified trimester: Secondary | ICD-10-CM

## 2013-05-28 MED ORDER — INTEGRA F 125-1 MG PO CAPS: 1.0000 | ORAL_CAPSULE | Freq: Every day | ORAL | Status: DC

## 2013-05-28 NOTE — Discharge Instructions (Signed)
Vaginal Bleeding During Pregnancy, Second Trimester °A small amount of bleeding (spotting) from the vagina is relatively common in pregnancy. It usually stops on its own. Various things can cause bleeding or spotting in pregnancy. Some bleeding may be related to the pregnancy, and some may not. Sometimes the bleeding is normal and is not a problem. However, bleeding can also be a sign of something serious. Be sure to tell your health care provider about any vaginal bleeding right away. °Some possible causes of vaginal bleeding during the second trimester include: °· Infection, inflammation, or growths on the cervix.   °· The placenta may be partially or completely covering the opening of the cervix inside the uterus (placenta previa). °· The placenta may have separated from the uterus (abruption of the placenta).   °· You may be having early (preterm) labor.   °· The cervix may not be strong enough to keep a baby inside the uterus (cervical insufficiency).   °· Tiny cysts may have developed in the uterus instead of pregnancy tissue (molar pregnancy).  °HOME CARE INSTRUCTIONS  °Watch your condition for any changes. The following actions may help to lessen any discomfort you are feeling: °· Follow your health care provider's instructions for limiting your activity. If your health care provider orders bed rest, you may need to stay in bed and only get up to use the bathroom. However, your health care provider may allow you to continue light activity. °· If needed, make plans for someone to help with your regular activities and responsibilities while you are on bed rest. °· Keep track of the number of pads you use each day, how often you change pads, and how soaked (saturated) they are. Write this down. °· Do not use tampons. Do not douche. °· Do not have sexual intercourse or orgasms until approved by your health care provider. °· If you pass any tissue from your vagina, save the tissue so you can show it to your  health care provider. °· Only take over-the-counter or prescription medicines as directed by your health care provider. °· Do not take aspirin because it can make you bleed. °· Do not exercise or perform any strenuous activities or heavy lifting without your health care provider's permission. °· Keep all follow-up appointments as directed by your health care provider. °SEEK MEDICAL CARE IF: °· You have any vaginal bleeding during any part of your pregnancy. °· You have cramps or labor pains. °SEEK IMMEDIATE MEDICAL CARE IF:  °· You have severe cramps in your back or belly (abdomen). °· You have contractions. °· You have a fever, not controlled by medicine. °· You have chills. °· You pass large clots or tissue from your vagina. °· Your bleeding increases. °· You feel lightheaded or weak, or you have fainting episodes. °· You are leaking fluid or have a gush of fluid from your vagina. °MAKE SURE YOU: °· Understand these instructions. °· Will watch your condition. °· Will get help right away if you are not doing well or get worse. °Document Released: 11/04/2004 Document Revised: 11/15/2012 Document Reviewed: 10/02/2012 °[employer]® Patient Information ©2014 [employer]. ° °

## 2013-05-30 LAB — HM PAP SMEAR: HM Pap smear: POSITIVE

## 2013-05-30 NOTE — MAU Provider Note (Signed)
Attestation of Attending Supervision of Advanced Practitioner (CNM/NP): Evaluation and management procedures were performed by the Advanced Practitioner under my supervision and collaboration.  I have reviewed the Advanced Practitioner's note and chart, and I agree with the management and plan.  Reva Pinkley Harraway-Smith 1:56 PM     

## 2013-10-09 LAB — OB RESULTS CONSOLE GBS: GBS: POSITIVE

## 2013-10-09 LAB — OB RESULTS CONSOLE GC/CHLAMYDIA
CHLAMYDIA, DNA PROBE: NEGATIVE
GC PROBE AMP, GENITAL: NEGATIVE

## 2013-11-06 ENCOUNTER — Telehealth (HOSPITAL_COMMUNITY): Payer: Self-pay | Admitting: *Deleted

## 2013-11-06 ENCOUNTER — Other Ambulatory Visit (HOSPITAL_COMMUNITY): Payer: Self-pay | Admitting: Nurse Practitioner

## 2013-11-06 ENCOUNTER — Encounter (HOSPITAL_COMMUNITY): Payer: Self-pay | Admitting: *Deleted

## 2013-11-06 DIAGNOSIS — O480 Post-term pregnancy: Secondary | ICD-10-CM

## 2013-11-06 NOTE — Telephone Encounter (Signed)
Preadmission screen  

## 2013-11-09 ENCOUNTER — Ambulatory Visit (HOSPITAL_COMMUNITY)
Admission: RE | Admit: 2013-11-09 | Discharge: 2013-11-09 | Disposition: A | Discharge status: Home / Self Care | Payer: Medicaid Other | Source: Ambulatory Visit | Attending: Nurse Practitioner | Admitting: Nurse Practitioner

## 2013-11-09 DIAGNOSIS — Z3A41 41 weeks gestation of pregnancy: Secondary | ICD-10-CM

## 2013-11-09 DIAGNOSIS — O480 Post-term pregnancy: Secondary | ICD-10-CM | POA: Insufficient documentation

## 2013-11-10 ENCOUNTER — Encounter (HOSPITAL_COMMUNITY): Payer: Self-pay | Admitting: *Deleted

## 2013-11-10 ENCOUNTER — Encounter (HOSPITAL_COMMUNITY): Payer: Medicaid Other | Admitting: Anesthesiology

## 2013-11-10 ENCOUNTER — Inpatient Hospital Stay (HOSPITAL_COMMUNITY)
Admission: AD | Admit: 2013-11-10 | Discharge: 2013-11-12 | DRG: 775 | Disposition: A | Discharge status: Home / Self Care | Payer: Medicaid Other | Source: Ambulatory Visit | Attending: Family Medicine | Admitting: Family Medicine

## 2013-11-10 ENCOUNTER — Inpatient Hospital Stay (HOSPITAL_COMMUNITY): Payer: Medicaid Other | Admitting: Anesthesiology

## 2013-11-10 DIAGNOSIS — Z3483 Encounter for supervision of other normal pregnancy, third trimester: Secondary | ICD-10-CM | POA: Diagnosis present

## 2013-11-10 DIAGNOSIS — IMO0001 Reserved for inherently not codable concepts without codable children: Secondary | ICD-10-CM

## 2013-11-10 DIAGNOSIS — O99824 Streptococcus B carrier state complicating childbirth: Principal | ICD-10-CM | POA: Diagnosis present

## 2013-11-10 DIAGNOSIS — Z3A40 40 weeks gestation of pregnancy: Secondary | ICD-10-CM | POA: Diagnosis present

## 2013-11-10 LAB — CBC
HCT: 32.4 % — ABNORMAL LOW (ref 36.0–46.0)
HEMOGLOBIN: 10.6 g/dL — AB (ref 12.0–15.0)
MCH: 27.5 pg (ref 26.0–34.0)
MCHC: 32.7 g/dL (ref 30.0–36.0)
MCV: 83.9 fL (ref 78.0–100.0)
Platelets: 202 10*3/uL (ref 150–400)
RBC: 3.86 MIL/uL — ABNORMAL LOW (ref 3.87–5.11)
RDW: 16.8 % — ABNORMAL HIGH (ref 11.5–15.5)
WBC: 11.2 10*3/uL — ABNORMAL HIGH (ref 4.0–10.5)

## 2013-11-10 LAB — TYPE AND SCREEN
ABO/RH(D): O POS
Antibody Screen: NEGATIVE

## 2013-11-10 MED ORDER — OXYTOCIN BOLUS FROM INFUSION
500.0000 mL | INTRAVENOUS | Status: DC
  Administered 2013-11-11: 500 mL via INTRAVENOUS

## 2013-11-10 MED ORDER — OXYCODONE-ACETAMINOPHEN 5-325 MG PO TABS: 1.0000 | ORAL_TABLET | ORAL | Status: DC | PRN

## 2013-11-10 MED ORDER — LACTATED RINGERS IV SOLN: 500.0000 mL | INTRAVENOUS | Status: DC | PRN

## 2013-11-10 MED ORDER — OXYTOCIN 40 UNITS IN LACTATED RINGERS INFUSION - SIMPLE MED
62.5000 mL/h | INTRAVENOUS | Status: DC
Start: 2013-11-10 — End: 2013-11-11
  Filled 2013-11-10: qty 1000

## 2013-11-10 MED ORDER — FENTANYL 2.5 MCG/ML BUPIVACAINE 1/10 % EPIDURAL INFUSION (WH - ANES)
INTRAMUSCULAR | Status: DC | PRN
Start: 2013-11-10 — End: 2013-11-11
  Administered 2013-11-10: 14 mL/h via EPIDURAL

## 2013-11-10 MED ORDER — DIPHENHYDRAMINE HCL 50 MG/ML IJ SOLN: 12.5000 mg | INTRAMUSCULAR | Status: DC | PRN

## 2013-11-10 MED ORDER — ONDANSETRON HCL 4 MG/2ML IJ SOLN
4.0000 mg | Freq: Four times a day (QID) | INTRAMUSCULAR | Status: DC | PRN
  Administered 2013-11-11: 4 mg via INTRAVENOUS
  Filled 2013-11-10: qty 2

## 2013-11-10 MED ORDER — LACTATED RINGERS IV SOLN
INTRAVENOUS | Status: DC
  Administered 2013-11-10 – 2013-11-11 (×2): via INTRAVENOUS

## 2013-11-10 MED ORDER — LIDOCAINE HCL (PF) 1 % IJ SOLN
30.0000 mL | INTRAMUSCULAR | Status: DC | PRN
  Filled 2013-11-10: qty 30

## 2013-11-10 MED ORDER — PENICILLIN G POTASSIUM 5000000 UNITS IJ SOLR
5.0000 10*6.[IU] | Freq: Once | INTRAVENOUS | Status: AC
  Administered 2013-11-10: 5 10*6.[IU] via INTRAVENOUS
  Filled 2013-11-10: qty 5

## 2013-11-10 MED ORDER — FENTANYL 2.5 MCG/ML BUPIVACAINE 1/10 % EPIDURAL INFUSION (WH - ANES)
14.0000 mL/h | INTRAMUSCULAR | Status: DC | PRN
  Administered 2013-11-10: 14 mL/h via EPIDURAL
  Filled 2013-11-10: qty 125

## 2013-11-10 MED ORDER — ACETAMINOPHEN 325 MG PO TABS: 650.0000 mg | ORAL_TABLET | ORAL | Status: DC | PRN

## 2013-11-10 MED ORDER — PHENYLEPHRINE 40 MCG/ML (10ML) SYRINGE FOR IV PUSH (FOR BLOOD PRESSURE SUPPORT)
80.0000 ug | PREFILLED_SYRINGE | INTRAVENOUS | Status: DC | PRN
  Filled 2013-11-10: qty 10
  Filled 2013-11-10: qty 2

## 2013-11-10 MED ORDER — LACTATED RINGERS IV SOLN
500.0000 mL | Freq: Once | INTRAVENOUS | Status: AC
  Administered 2013-11-10: 500 mL via INTRAVENOUS

## 2013-11-10 MED ORDER — CITRIC ACID-SODIUM CITRATE 334-500 MG/5ML PO SOLN: 30.0000 mL | ORAL | Status: DC | PRN

## 2013-11-10 MED ORDER — DEXTROSE 5 % IV SOLN
2.5000 10*6.[IU] | INTRAVENOUS | Status: DC
  Administered 2013-11-10 – 2013-11-11 (×2): 2.5 10*6.[IU] via INTRAVENOUS
  Filled 2013-11-10 (×5): qty 2.5

## 2013-11-10 MED ORDER — LIDOCAINE HCL (PF) 1 % IJ SOLN
INTRAMUSCULAR | Status: DC | PRN
  Administered 2013-11-10 (×2): 4 mL

## 2013-11-10 MED ORDER — PHENYLEPHRINE 40 MCG/ML (10ML) SYRINGE FOR IV PUSH (FOR BLOOD PRESSURE SUPPORT)
80.0000 ug | PREFILLED_SYRINGE | INTRAVENOUS | Status: DC | PRN
  Filled 2013-11-10: qty 2

## 2013-11-10 MED ORDER — OXYCODONE-ACETAMINOPHEN 5-325 MG PO TABS: 2.0000 | ORAL_TABLET | ORAL | Status: DC | PRN

## 2013-11-10 MED ORDER — EPHEDRINE 5 MG/ML INJ
10.0000 mg | INTRAVENOUS | Status: DC | PRN
  Filled 2013-11-10: qty 2

## 2013-11-10 MED ORDER — FENTANYL 2.5 MCG/ML BUPIVACAINE 1/10 % EPIDURAL INFUSION (WH - ANES)
14.0000 mL/h | INTRAMUSCULAR | Status: DC | PRN
  Administered 2013-11-11: 14 mL/h via EPIDURAL
  Filled 2013-11-10: qty 125

## 2013-11-10 NOTE — Anesthesia Procedure Notes (Signed)
Epidural Patient location during procedure: OB  Staffing Anesthesiologist: Atlas Kuc R Performed by: anesthesiologist   Preanesthetic Checklist Completed: patient identified, pre-op evaluation, timeout performed, IV checked, risks and benefits discussed and monitors and equipment checked  Epidural Patient position: sitting Prep: site prepped and draped and DuraPrep Patient monitoring: heart rate Approach: midline Location: L3-L4 Injection technique: LOR air and LOR saline  Needle:  Needle type: Tuohy  Needle gauge: 17 G Needle length: 9 cm Needle insertion depth: 5 cm Catheter type: closed end flexible Catheter size: 19 Gauge Catheter at skin depth: 11 cm Test dose: negative  Assessment Sensory level: T8 Events: blood not aspirated, injection not painful, no injection resistance, negative IV test and no paresthesia  Additional Notes Reason for block:procedure for pain   

## 2013-11-10 NOTE — MAU Note (Signed)
Contractions started last night and have gotten worse since then, had a small amount of white discharge and a little bit of blood with it.  Denies LOF.

## 2013-11-10 NOTE — Progress Notes (Signed)
Elaine Meyer is a 23 y.o. G2P1001 at 130w4d by ultrasound admitted for onset of labor and postdates  Subjective: Comfortable  Objective: BP 113/72  Pulse 88  Temp(Src) 98.2 F (36.8 C) (Oral)  Resp 18  Ht 5\' 3"  (1.6 m)  Wt 79.833 kg (176 lb)  BMI 31.18 kg/m2  SpO2 100%      FHT:  FHR: 140 bpm, variability: moderate,  accelerations:  Present,  decelerations:  Absent UC:   irregular, every 2-4 minutes SVE:   Dilation: 6 Effacement (%): 70 Station: -2 Exam by:: Dr. Delford FieldWright  Labs: Lab Results  Component Value Date   WBC 11.2* 11/10/2013   HGB 10.6* 11/10/2013   HCT 32.4* 11/10/2013   MCV 83.9 11/10/2013   PLT 202 11/10/2013    Assessment / Plan: Spontaneous labor, progressing normally SROM around 2030.  Labor: Progressing normally Preeclampsia:  no signs or symptoms of toxicity Fetal Wellbeing:  Category I Pain Control:  Epidural I/D:  GBS positive, PCN prophylaxis Anticipated MOD:  NSVD  Tawni CarnesWight, Tyra Gural 11/10/2013, 9:32 PM

## 2013-11-10 NOTE — H&P (Signed)
LABOR ADMISSION HISTORY AND PHYSICAL  Elaine Elaine Meyer is a 23 y.o. female G2P1001 with IUP at 1175w4d by ultrasound presenting with SOL. She reports +FMs, No LOF, no VB, no blurry vision, headaches or peripheral edema, and RUQ pain. She desires an epidural for labor pain control. She plans on breast feeding.  Dating: By LMP cw 3375w6d sono --->  Estimated Date of Delivery: 11/06/13   Prenatal History/Complications:  Past Medical History: Past Medical History  Diagnosis Date  . Abnormal Pap smear     HPV  . Vaginal Pap smear, abnormal     Past Surgical History: Past Surgical History  Procedure Laterality Date  . No past surgeries      Obstetrical History: OB History   Grav Para Term Preterm Abortions TAB SAB Ect Mult Living   2 1 1       1        Social History: History   Social History  . Marital Status: Single    Spouse Name: N/A    Number of Children: N/A  . Years of Education: N/A   Social History Main Topics  . Smoking status: Never Smoker   . Smokeless tobacco: Never Used  . Alcohol Use: No  . Drug Use: No  . Sexual Activity: Yes     Comment: last sex 6 days ago   Other Topics Concern  . None   Social History Narrative  . None    Family History: Family History  Problem Relation Age of Onset  . Alcohol abuse Neg Hx   . Arthritis Neg Hx   . Birth defects Neg Hx   . Cancer Neg Hx   . COPD Neg Hx   . Diabetes Neg Hx   . Drug abuse Neg Hx   . Early death Neg Hx   . Hearing loss Neg Hx   . Heart disease Neg Hx   . Hyperlipidemia Neg Hx   . Hypertension Neg Hx   . Kidney disease Neg Hx   . Learning disabilities Neg Hx   . Mental illness Neg Hx   . Mental retardation Neg Hx   . Miscarriages / Stillbirths Neg Hx   . Stroke Neg Hx   . Vision loss Neg Hx   . Other Neg Hx   . Anemia Mother   . Asthma Sister   . Anemia Sister   . Depression Sister   . Asthma Brother     Allergies: No Known Allergies  Prescriptions prior to admission   Medication Sig Dispense Refill  . Fe Fum-FePoly-FA-Vit C-Vit B3 (INTEGRA F) 125-1 MG CAPS Take 1 tablet by mouth daily.  30 capsule  1     Review of Systems   All systems reviewed and negative except as stated in HPI  Blood pressure 104/60, pulse 91, temperature 99 F (37.2 C), temperature source Oral, resp. rate 18, height 5\' 3"  (1.6 m), weight 176 lb (79.833 kg), SpO2 100.00%. General appearance: alert and cooperative Lungs: clear to auscultation bilaterally Heart: regular rate and rhythm Abdomen: soft, non-tender; bowel sounds normal Extremities: Homans sign is negative, no sign of DVT DTR's not examined Presentation: cephalic Fetal monitoringBaseline: 145 bpm, Variability: Good {> 6 bpm), Accelerations: Reactive and Decelerations: Absent Uterine activityq313min   Dilation: 6 Effacement (%): 70 Station: -2 Exam by:: Dr. Delford FieldWright   Prenatal labs: ABO, Rh: --/--/O POS (10/03 2022) Antibody: NEG (10/03 2022) Rubella:   RPR: Nonreactive (04/13 0000)  HBsAg: Negative (04/13 0000)  HIV: Non-reactive (  04/13 0000)  GBS: Positive (09/01 0000)  1 hr Glucola 83 Genetic screening  Normal quad Anatomy US normal    Results for orders placed during the hospital encounter of 11/10/13 (from the past 24 hour(s))  CBC   Collection Time    11/10/13  8:22 PM      Result Value Ref Range   WBC 11.2 (*) 4.0 - 10.5 K/uL   RBC 3.86 (*) 3.87 - 5.11 MIL/uL   Hemoglobin 10.6 (*) 12.0 - 15.0 g/dL   HCT 16.1 (*) 09.6 - 04.5 %   MCV 83.9  78.0 - 100.0 fL   MCH 27.5  26.0 - 34.0 pg   MCHC 32.7  30.0 - 36.0 g/dL   RDW 40.9 (*) 81.1 - 91.4 %   Platelets 202  150 - 400 K/uL  TYPE AND SCREEN   Collection Time    11/10/13  8:22 PM      Result Value Ref Range   ABO/RH(D) O POS     Antibody Screen NEG     Sample Expiration 11/13/2013      Patient Active Problem List   Diagnosis Date Noted  . Active labor 11/10/2013    Assessment: Elaine Meyer is a 23 y.o. G2P1001 at [redacted]w[redacted]d  here for SOL  #Labor: progressing normally #Pain: Epidural upon request #FWB: Cat I #ID:  GBS+, PCN #MOF: breast #MOC:PCN Abnormal pap, ASC-US HPV+ => needs repeat 12months with cotesting  Elaine Meyer Elaine Meyer 11/10/2013, 11:56 PM

## 2013-11-10 NOTE — MAU Note (Signed)
Pt to go to room 172 

## 2013-11-10 NOTE — Anesthesia Preprocedure Evaluation (Signed)
Anesthesia Evaluation  Patient identified by MRN, date of birth, ID band Patient awake    Reviewed: Allergy & Precautions, H&P , NPO status , Patient's Chart, lab work & pertinent test results, reviewed documented beta blocker date and time   History of Anesthesia Complications Negative for: history of anesthetic complications  Airway Mallampati: I TM Distance: >3 FB Neck ROM: full    Dental  (+) Teeth Intact   Pulmonary neg pulmonary ROS,  breath sounds clear to auscultation        Cardiovascular negative cardio ROS  Rhythm:regular Rate:Normal     Neuro/Psych negative neurological ROS  negative psych ROS   GI/Hepatic negative GI ROS, Neg liver ROS,   Endo/Other  negative endocrine ROS  Renal/GU negative Renal ROS     Musculoskeletal   Abdominal   Peds  Hematology  (+) Blood dyscrasia (hgb 9.8), anemia ,   Anesthesia Other Findings   Reproductive/Obstetrics (+) Pregnancy                           Anesthesia Physical  Anesthesia Plan  ASA: II  Anesthesia Plan: Epidural   Post-op Pain Management:    Induction:   Airway Management Planned:   Additional Equipment:   Intra-op Plan:   Post-operative Plan:   Informed Consent: I have reviewed the patients History and Physical, chart, labs and discussed the procedure including the risks, benefits and alternatives for the proposed anesthesia with the patient or authorized representative who has indicated his/her understanding and acceptance.     Plan Discussed with:   Anesthesia Plan Comments:         Anesthesia Quick Evaluation

## 2013-11-11 ENCOUNTER — Encounter (HOSPITAL_COMMUNITY): Payer: Self-pay | Admitting: General Practice

## 2013-11-11 DIAGNOSIS — O99824 Streptococcus B carrier state complicating childbirth: Secondary | ICD-10-CM

## 2013-11-11 DIAGNOSIS — Z3A40 40 weeks gestation of pregnancy: Secondary | ICD-10-CM

## 2013-11-11 LAB — CBC
HCT: 29.3 % — ABNORMAL LOW (ref 36.0–46.0)
Hemoglobin: 9.5 g/dL — ABNORMAL LOW (ref 12.0–15.0)
MCH: 26.9 pg (ref 26.0–34.0)
MCHC: 32.4 g/dL (ref 30.0–36.0)
MCV: 83 fL (ref 78.0–100.0)
Platelets: 172 10*3/uL (ref 150–400)
RBC: 3.53 MIL/uL — ABNORMAL LOW (ref 3.87–5.11)
RDW: 16.8 % — AB (ref 11.5–15.5)
WBC: 21.1 10*3/uL — ABNORMAL HIGH (ref 4.0–10.5)

## 2013-11-11 LAB — RPR

## 2013-11-11 LAB — HIV ANTIBODY (ROUTINE TESTING W REFLEX): HIV 1&2 Ab, 4th Generation: NONREACTIVE

## 2013-11-11 MED ORDER — SIMETHICONE 80 MG PO CHEW: 80.0000 mg | CHEWABLE_TABLET | ORAL | Status: DC | PRN

## 2013-11-11 MED ORDER — DIPHENHYDRAMINE HCL 25 MG PO CAPS: 25.0000 mg | ORAL_CAPSULE | Freq: Four times a day (QID) | ORAL | Status: DC | PRN

## 2013-11-11 MED ORDER — ONDANSETRON HCL 4 MG PO TABS: 4.0000 mg | ORAL_TABLET | ORAL | Status: DC | PRN

## 2013-11-11 MED ORDER — IBUPROFEN 600 MG PO TABS
600.0000 mg | ORAL_TABLET | Freq: Four times a day (QID) | ORAL | Status: DC
  Administered 2013-11-11 – 2013-11-12 (×5): 600 mg via ORAL
  Filled 2013-11-11 (×5): qty 1

## 2013-11-11 MED ORDER — OXYCODONE-ACETAMINOPHEN 5-325 MG PO TABS: 1.0000 | ORAL_TABLET | ORAL | Status: DC | PRN

## 2013-11-11 MED ORDER — TETANUS-DIPHTH-ACELL PERTUSSIS 5-2.5-18.5 LF-MCG/0.5 IM SUSP: 0.5000 mL | Freq: Once | INTRAMUSCULAR | Status: DC

## 2013-11-11 MED ORDER — PRENATAL MULTIVITAMIN CH
1.0000 | ORAL_TABLET | Freq: Every day | ORAL | Status: DC
  Administered 2013-11-11: 1 via ORAL
  Filled 2013-11-11: qty 1

## 2013-11-11 MED ORDER — SENNOSIDES-DOCUSATE SODIUM 8.6-50 MG PO TABS
2.0000 | ORAL_TABLET | ORAL | Status: DC
  Administered 2013-11-11: 2 via ORAL
  Filled 2013-11-11: qty 2

## 2013-11-11 MED ORDER — LANOLIN HYDROUS EX OINT: TOPICAL_OINTMENT | CUTANEOUS | Status: DC | PRN

## 2013-11-11 MED ORDER — ONDANSETRON HCL 4 MG/2ML IJ SOLN: 4.0000 mg | INTRAMUSCULAR | Status: DC | PRN

## 2013-11-11 MED ORDER — ZOLPIDEM TARTRATE 5 MG PO TABS: 5.0000 mg | ORAL_TABLET | Freq: Every evening | ORAL | Status: DC | PRN

## 2013-11-11 MED ORDER — DIBUCAINE 1 % RE OINT: 1.0000 "application " | TOPICAL_OINTMENT | RECTAL | Status: DC | PRN

## 2013-11-11 MED ORDER — OXYCODONE-ACETAMINOPHEN 5-325 MG PO TABS: 2.0000 | ORAL_TABLET | ORAL | Status: DC | PRN

## 2013-11-11 MED ORDER — WITCH HAZEL-GLYCERIN EX PADS: 1.0000 "application " | MEDICATED_PAD | CUTANEOUS | Status: DC | PRN

## 2013-11-11 MED ORDER — BENZOCAINE-MENTHOL 20-0.5 % EX AERO: 1.0000 "application " | INHALATION_SPRAY | CUTANEOUS | Status: DC | PRN

## 2013-11-11 NOTE — Lactation Note (Signed)
This note was copied from the chart of Elaine Meyer. Lactation Consultation Note  Mother states baby has been sleepy and will not wake to breastfeeding. Assisted in undressing baby.  Reviewed waking techniques. Mother hand expressed drops of colostrum and baby latched. Some sucks swallows observed. Reviewed how to achieve a deeped latch with cross cradle.  Patient Name: Elaine Meyer ZOXWR'UToday's Date: 11/11/2013 Reason for consult: Follow-up assessment   Maternal Data Has patient been taught Hand Expression?: Yes  Feeding Feeding Type: Breast Fed  LATCH Score/Interventions Latch: Grasps breast easily, tongue down, lips flanged, rhythmical sucking.  Audible Swallowing: A few with stimulation  Type of Nipple: Everted at rest and after stimulation  Comfort (Breast/Nipple): Soft / non-tender     Hold (Positioning): Assistance needed to correctly position infant at breast and maintain latch.  LATCH Score: 8  Lactation Tools Discussed/Used     Consult Status Consult Status: Follow-up Date: 11/12/13 Follow-up type: In-patient    Elaine Meyer, Elaine Meyer [facility] 11/11/2013, 3:04 PM

## 2013-11-11 NOTE — Anesthesia Postprocedure Evaluation (Signed)
  Anesthesia Post-op Note  Patient: Elaine Meyer  Procedure(s) Performed: * No procedures listed *  Patient Location: Mother/Baby  Anesthesia Type:Epidural  Level of Consciousness: awake  Airway and Oxygen Therapy: Patient Spontanous Breathing  Post-op Pain: mild  Post-op Assessment: Patient's Cardiovascular Status Stable and Respiratory Function Stable  Post-op Vital Signs: stable  Last Vitals:  Filed Vitals:   11/11/13 1134  BP: 107/54  Pulse: 90  Temp: 36.8 C  Resp: 16    Complications: No apparent anesthesia complications

## 2013-11-11 NOTE — Lactation Note (Signed)
This note was copied from the chart of Elaine Meyer. Lactation Consultation Note Follow up visit at 18 hours of age.  Mom is reports baby is smacking and make a popping off sound at the breast.  Attempted feeding baby in full clothing, encouraged STS with next feedings.  Baby latches well with wide flanged lips and several strong sucks, audible "smacking" sounds and baby stops feeding.  After several attempts baby was also heard to "pop" off and instead of pulling off she lays close to mom, with nipple out of mouth.  Encouraged mom to keep baby active for feedings she is sleepy now.  Baby sucked on gloved finger and noted to have a weak grasp around corner of her mouth, I expect this to improve with practice when baby is more awake for feedings.  Mom to call RN for assist as needed.     Patient Name: Elaine Meyer ZOXWR'UToday's Date: 11/11/2013 Reason for consult: Follow-up assessment;Difficult latch   Maternal Data    Feeding Feeding Type: Breast Fed Length of feed:  (few strong sucks baby sleepy)  LATCH Score/Interventions Latch: Repeated attempts needed to sustain latch, nipple held in mouth throughout feeding, stimulation needed to elicit sucking reflex.  Audible Swallowing: None  Type of Nipple: Everted at rest and after stimulation  Comfort (Breast/Nipple): Soft / non-tender     Hold (Positioning): No assistance needed to correctly position infant at breast. Intervention(s): Skin to skin;Position options;Support Pillows;Breastfeeding basics reviewed  LATCH Score: 7  Lactation Tools Discussed/Used     Consult Status Consult Status: Follow-up Date: 11/12/13 Follow-up type: In-patient    [name], [name] 11/11/2013, 11:08 PM

## 2013-11-11 NOTE — H&P (Signed)
Attestation of Attending Supervision of Advanced Practitioner (PA/CNM/NP): Evaluation and management procedures were performed by the Advanced Practitioner under my supervision and collaboration.  I have reviewed the Advanced Practitioner's note and chart, and I agree with the management and plan.  Kemiyah Tarazon S, MD [facility] Attending 11/11/2013 12:14 AM   

## 2013-11-11 NOTE — Addendum Note (Signed)
Addendum created 11/11/13 1504 by Renford DillsJanet L Sanav Remer, CRNA   Modules edited: Charges VN, Notes Section   Notes Section:  File: [id number]

## 2013-11-12 MED ORDER — IBUPROFEN 600 MG PO TABS: 600.0000 mg | ORAL_TABLET | Freq: Four times a day (QID) | ORAL | Status: DC

## 2013-11-12 NOTE — Progress Notes (Signed)
Post Partum Day 1 Subjective: no complaints, up ad lib, voiding, tolerating PO and + flatus  Objective: Blood pressure 108/68, pulse 85, temperature 98 F (36.7 C), temperature source Oral, resp. rate 18, height 5\' 3"  (1.6 m), weight 79.833 kg (176 lb), SpO2 100.00%, unknown if currently breastfeeding.  Physical Exam:  General: alert, cooperative and appears stated age Lochia: appropriate Uterine Fundus: firm DVT Evaluation: No evidence of DVT seen on physical exam. No cords or calf tenderness. No significant calf/ankle edema.   Recent Labs  11/10/13 2022 11/11/13 0705  HGB 10.6* 9.5*  HCT 32.4* 29.3*    Assessment/Plan: Discharge home, Breastfeeding and Contraception is undecided at this time   LOS: 2 days   Rosemary HolmsHaller, Kyrie Fludd 11/12/2013, 7:57 AM

## 2013-11-12 NOTE — Discharge Summary (Signed)
OB fellow attestation:  I have seen and examined this patient; I agree with above documentation in the student's note.   Elaine Meyer is a 23 y.o. [id number] s/p NSVD. Doing well, bleeding less than menses. + ambulation, passing gas. Pain well controlled.  PE: BP 108/68  Pulse 85  Temp(Src) 98 F (36.7 C) (Oral)  Resp 18  Ht 5\' 3"  (1.6 m)  Wt 176 lb (79.833 kg)  BMI 31.18 kg/m2  SpO2 100%  Breastfeeding? Unknown Gen: calm comfortable, NAD Resp: normal effort, no distress Abd: uterus below umbilcius  ROS, labs, PMH reviewed  Plan: - D/c home - f/up 4-6 wks w/ reg OB   Elita Booneoberts, Burleigh Brockmann C, MD 10:34 AM

## 2013-11-12 NOTE — Progress Notes (Signed)
UR chart review completed.  

## 2013-11-12 NOTE — Discharge Instructions (Signed)

## 2013-11-12 NOTE — Lactation Note (Signed)
This note was copied from the chart of Elaine Meyer. Lactation Consultation Note: mom had baby latched to breast when I went into room. Reports she is still making the popping sound but it is getting better. Comfort gels given with instructions. Reports that breasts are feeling fuller this morning. No questions at present. To call prn  Patient Name: Elaine Meyer BJYNW'GToday's Date: 11/12/2013 Reason for consult: Follow-up assessment   Maternal Data Formula Feeding for Exclusion: No Has patient been taught Hand Expression?: Yes Does the patient have breastfeeding experience prior to this delivery?: Yes  Feeding Feeding Type: Breast Fed Length of feed: 10 min  LATCH Score/Interventions Latch: Grasps breast easily, tongue down, lips flanged, rhythmical sucking.  Audible Swallowing: A few with stimulation  Type of Nipple: Everted at rest and after stimulation  Comfort (Breast/Nipple): Filling, red/small blisters or bruises, mild/mod discomfort  Problem noted: Mild/Moderate discomfort Interventions (Mild/moderate discomfort): Comfort gels  Hold (Positioning): Assistance needed to correctly position infant at breast and maintain latch. Intervention(s): Breastfeeding basics reviewed;Support Pillows  LATCH Score: 7  Lactation Tools Discussed/Used     Consult Status Consult Status: Complete    Pamelia HoitWeeks, Jaana Brodt D 11/12/2013, 8:30 AM

## 2013-11-12 NOTE — Discharge Summary (Signed)
Obstetric Discharge Summary Reason for Admission: onset of labor Prenatal Procedures: none Intrapartum Procedures: spontaneous vaginal delivery Postpartum Procedures: none Complications-Operative and Postpartum: none Hemoglobin  Date Value Ref Range Status  11/11/2013 9.5* 12.0 - 15.0 g/dL Final     HCT  Date Value Ref Range Status  11/11/2013 29.3* 36.0 - 46.0 % Final    Physical Exam:  General: alert, cooperative and appears stated age Lochia: appropriate Uterine Fundus: firm DVT Evaluation: No evidence of DVT seen on physical exam. No cords or calf tenderness. No significant calf/ankle edema.  Discharge Diagnoses: Term Pregnancy-delivered  Discharge Information: Date: 11/12/2013 Activity: pelvic rest Diet: routine Medications: Ibuprofen Condition: stable Instructions: refer to practice specific booklet Discharge to: home   Newborn Data: Live born female  Birth Weight: 8 lb 8 oz (3855 g) APGAR: 7, 9  Home with FOB.  Elaine Meyer, Elaine Meyer 11/12/2013, 8:56 AM

## 2013-11-12 NOTE — Progress Notes (Signed)
I saw and evaluated the patient. I agree with the findings and the plan of care as documented in the resident's note

## 2013-11-16 ENCOUNTER — Inpatient Hospital Stay (HOSPITAL_COMMUNITY): Admission: RE | Admit: 2013-11-16 | Payer: Medicaid Other | Source: Ambulatory Visit

## 2013-11-16 ENCOUNTER — Other Ambulatory Visit: Payer: Medicaid Other

## 2013-12-10 ENCOUNTER — Encounter (HOSPITAL_COMMUNITY): Payer: Self-pay | Admitting: General Practice

## 2015-04-30 ENCOUNTER — Encounter (HOSPITAL_COMMUNITY): Payer: Self-pay | Admitting: *Deleted

## 2015-04-30 DIAGNOSIS — N390 Urinary tract infection, site not specified: Secondary | ICD-10-CM | POA: Insufficient documentation

## 2015-04-30 DIAGNOSIS — Z3202 Encounter for pregnancy test, result negative: Secondary | ICD-10-CM | POA: Insufficient documentation

## 2015-04-30 DIAGNOSIS — Z79899 Other long term (current) drug therapy: Secondary | ICD-10-CM | POA: Insufficient documentation

## 2015-04-30 LAB — URINALYSIS, ROUTINE W REFLEX MICROSCOPIC
BILIRUBIN URINE: NEGATIVE
Glucose, UA: NEGATIVE mg/dL
KETONES UR: NEGATIVE mg/dL
Nitrite: NEGATIVE
Protein, ur: 100 mg/dL — AB
Specific Gravity, Urine: 1.026 (ref 1.005–1.030)
pH: 7.5 (ref 5.0–8.0)

## 2015-04-30 LAB — URINE MICROSCOPIC-ADD ON

## 2015-04-30 LAB — PREGNANCY, URINE: PREG TEST UR: NEGATIVE

## 2015-04-30 NOTE — ED Notes (Signed)
pt c/o urinary urgency x 2 weeks. States there has also been blood in her urine.

## 2015-05-01 ENCOUNTER — Emergency Department (HOSPITAL_COMMUNITY)
Admission: EM | Admit: 2015-05-01 | Discharge: 2015-05-01 | Disposition: A | Discharge status: Home / Self Care | Payer: Self-pay | Attending: Emergency Medicine | Admitting: Emergency Medicine

## 2015-05-01 DIAGNOSIS — N390 Urinary tract infection, site not specified: Secondary | ICD-10-CM

## 2015-05-01 MED ORDER — PHENAZOPYRIDINE HCL 200 MG PO TABS: 200.0000 mg | ORAL_TABLET | Freq: Three times a day (TID) | ORAL | Status: DC | PRN

## 2015-05-01 MED ORDER — CEPHALEXIN 500 MG PO CAPS: 500.0000 mg | ORAL_CAPSULE | Freq: Four times a day (QID) | ORAL | Status: DC

## 2015-05-01 MED ORDER — CEPHALEXIN 250 MG PO CAPS
500.0000 mg | ORAL_CAPSULE | Freq: Once | ORAL | Status: AC
  Administered 2015-05-01: 500 mg via ORAL
  Filled 2015-05-01: qty 2

## 2015-05-01 NOTE — ED Provider Notes (Signed)
CSN: [id number]     Arrival date & time 04/30/15  2100 History  By signing my name below, I, Doreatha Martin, attest that this documentation has been prepared under the direction and in the presence of Marily Memos, MD. Electronically Signed: Doreatha Martin, ED Scribe. 05/01/2015. 2:02 AM.   No chief complaint on file.  The history is provided by the patient. No language interpreter was used.   HPI Comments: Elaine Meyer is a 25 y.o. female who presents to the Emergency Department complaining of moderate, sharp dysuria onset 2 weeks ago with associated hematuria, decreased urine volume, urgency, frequency, lower abdominal fullness. She reports that she sees clots of blood when she urinates. She notes that she tried an OTC UTI medication for 3 days with no relief. Pt states she has also tried drinking water and cranberry juice with no relief. No h/o of similar symptoms, UTI, renal calculi. Pt is not currently followed by a PCP. She denies fever, chills, flank pain, abdominal pain, back pain, nausea, emesis.   Past Medical History  Diagnosis Date  . Abnormal Pap smear     HPV  . Vaginal Pap smear, abnormal    Past Surgical History  Procedure Laterality Date  . No past surgeries     Family History  Problem Relation Age of Onset  . Alcohol abuse Neg Hx   . Arthritis Neg Hx   . Birth defects Neg Hx   . Cancer Neg Hx   . COPD Neg Hx   . Diabetes Neg Hx   . Drug abuse Neg Hx   . Early death Neg Hx   . Hearing loss Neg Hx   . Heart disease Neg Hx   . Hyperlipidemia Neg Hx   . Hypertension Neg Hx   . Kidney disease Neg Hx   . Learning disabilities Neg Hx   . Mental illness Neg Hx   . Mental retardation Neg Hx   . Miscarriages / Stillbirths Neg Hx   . Stroke Neg Hx   . Vision loss Neg Hx   . Other Neg Hx   . Anemia Mother   . Asthma Sister   . Anemia Sister   . Depression Sister   . Asthma Brother    Social History  Substance Use Topics  . Smoking status: Never Smoker    . Smokeless tobacco: Never Used  . Alcohol Use: No   OB History    Gravida Para Term Preterm AB TAB SAB Ectopic Multiple Living   Review of Systems  Constitutional: Negative for fever and chills.  Gastrointestinal: Negative for nausea, vomiting and abdominal pain.  Genitourinary: Positive for dysuria, urgency, frequency, hematuria and decreased urine volume. Negative for flank pain.  Musculoskeletal: Negative for back pain.  All other systems reviewed and are negative.  Allergies  Review of patient's allergies indicates no known allergies.  Home Medications   Prior to Admission medications   Medication Sig Start Date End Date Taking? Authorizing Provider  cephALEXin (KEFLEX) 500 MG capsule Take 1 capsule (500 mg total) by mouth 4 (four) times daily. 05/01/15   Marily Memos, MD  Fe Fum-FePoly-FA-Vit C-Vit B3 (INTEGRA F) 125-1 MG CAPS Take 1 tablet by mouth daily. 05/28/13   Marlis Edelson, CNM  ibuprofen (ADVIL,MOTRIN) 600 MG tablet Take 1 tablet (600 mg total) by mouth every 6 (six) hours. 11/12/13   Ethelda Chick, MD  phenazopyridine (PYRIDIUM)  200 MG tablet Take 1 tablet (200 mg total) by mouth 3 (three) times daily as needed for pain. 05/01/15   Marily Memos, MD   BP 118/68 mmHg  Pulse 73  Temp(Src) 98.1 F (36.7 C) (Oral)  Resp 16  SpO2 100%  LMP 04/02/2015 Physical Exam  Constitutional: She is oriented to person, place, and time. She appears well-developed and well-nourished.  HENT:  Head: Normocephalic and atraumatic.  Eyes: Conjunctivae and EOM are normal. Pupils are equal, round, and reactive to light.  Neck: Normal range of motion. Neck supple.  Cardiovascular: Normal rate, regular rhythm and normal heart sounds.  Exam reveals no gallop and no friction rub.   No murmur heard. Pulmonary/Chest: Effort normal and breath sounds normal. No respiratory distress. She has no wheezes. She has no rales.  Abdominal: Soft. Bowel sounds are normal. She  exhibits no distension and no mass. There is tenderness. There is no rebound and no guarding.  Minimal suprapubic tenderness, likely secondary to bladder fullness. No CVA tenderness.   Musculoskeletal: Normal range of motion.  Neurological: She is alert and oriented to person, place, and time.  Skin: Skin is warm and dry.  Psychiatric: She has a normal mood and affect. Her behavior is normal.  Nursing note and vitals reviewed.   ED Course  Procedures (including critical care time) DIAGNOSTIC STUDIES: Oxygen Saturation is 100% on RA, normal by my interpretation.    COORDINATION OF CARE: 2:01 AM Discussed treatment plan with pt at bedside which includes UA and pt agreed to plan.   Labs Review Labs Reviewed  URINALYSIS, ROUTINE W REFLEX MICROSCOPIC (NOT AT [facility]) - Abnormal; Notable for the following:    Color, Urine RED (*)    APPearance CLOUDY (*)    Hgb urine dipstick LARGE (*)    Protein, ur 100 (*)    Leukocytes, UA MODERATE (*)    All other components within normal limits  URINE MICROSCOPIC-ADD ON - Abnormal; Notable for the following:    Squamous Epithelial / LPF 6-30 (*)    Bacteria, UA FEW (*)    All other components within normal limits  PREGNANCY, URINE    I have personally reviewed and evaluated these lab results as part of my medical decision-making.   MDM   Final diagnoses:  UTI (lower urinary tract infection)    Dysuria and hematuria likely 2/2 UTI. Will treat with keflex, however will need to follow up with UC/PCP in a couple weeks to ensure clearing of blood/bacteria. Info given for PCP follow up. No e/o pyelo or systemic illness. No back pain to suggest primary renal pathology.   New Prescriptions: Discharge Medication List as of 05/01/2015  2:04 AM    START taking these medications   Details  cephALEXin (KEFLEX) 500 MG capsule Take 1 capsule (500 mg total) by mouth 4 (four) times daily., Starting 05/01/2015, Until Discontinued, Print    phenazopyridine  (PYRIDIUM) 200 MG tablet Take 1 tablet (200 mg total) by mouth 3 (three) times daily as needed for pain., Starting 05/01/2015, Until Discontinued, Print         I have personally and contemperaneously reviewed labs and imaging and used in my decision making as above.   A medical screening exam was performed and I feel the patient has had an appropriate workup for their chief complaint at this time and likelihood of emergent condition existing is low. Their vital signs are stable. They have been counseled on decision, discharge, follow up and which symptoms  necessitate immediate return to the emergency department.  They verbally stated understanding and agreement with plan and discharged in stable condition.    I personally performed the services described in this documentation, which was scribed in my presence. The recorded information has been reviewed and is accurate.    Marily MemosJason Lajuan Godbee, MD 05/01/15 [id number]

## 2016-02-16 ENCOUNTER — Emergency Department (HOSPITAL_COMMUNITY)
Admission: EM | Admit: 2016-02-16 | Discharge: 2016-02-16 | Disposition: A | Discharge status: Home / Self Care | Payer: Self-pay | Attending: Emergency Medicine | Admitting: Emergency Medicine

## 2016-02-16 ENCOUNTER — Encounter (HOSPITAL_COMMUNITY): Payer: Self-pay | Admitting: *Deleted

## 2016-02-16 DIAGNOSIS — Z7251 High risk heterosexual behavior: Secondary | ICD-10-CM

## 2016-02-16 DIAGNOSIS — R8281 Pyuria: Secondary | ICD-10-CM

## 2016-02-16 DIAGNOSIS — R319 Hematuria, unspecified: Secondary | ICD-10-CM | POA: Insufficient documentation

## 2016-02-16 DIAGNOSIS — N390 Urinary tract infection, site not specified: Secondary | ICD-10-CM | POA: Insufficient documentation

## 2016-02-16 DIAGNOSIS — Z79899 Other long term (current) drug therapy: Secondary | ICD-10-CM | POA: Insufficient documentation

## 2016-02-16 LAB — URINALYSIS, ROUTINE W REFLEX MICROSCOPIC
Bilirubin Urine: NEGATIVE
Glucose, UA: NEGATIVE mg/dL
Ketones, ur: NEGATIVE mg/dL
Nitrite: NEGATIVE
PROTEIN: 100 mg/dL — AB
Specific Gravity, Urine: 1.025 (ref 1.005–1.030)
pH: 5 (ref 5.0–8.0)

## 2016-02-16 LAB — WET PREP, GENITAL
Sperm: NONE SEEN
TRICH WET PREP: NONE SEEN
Yeast Wet Prep HPF POC: NONE SEEN

## 2016-02-16 LAB — POC URINE PREG, ED: PREG TEST UR: NEGATIVE

## 2016-02-16 MED ORDER — CEPHALEXIN 500 MG PO CAPS: 500.0000 mg | ORAL_CAPSULE | Freq: Four times a day (QID) | ORAL | 0 refills | Status: DC

## 2016-02-16 MED ORDER — LIDOCAINE HCL (PF) 1 % IJ SOLN
INTRAMUSCULAR | Status: AC
  Administered 2016-02-16: 5 mL
  Filled 2016-02-16: qty 5

## 2016-02-16 MED ORDER — AZITHROMYCIN 250 MG PO TABS
1000.0000 mg | ORAL_TABLET | Freq: Once | ORAL | Status: AC
  Administered 2016-02-16: 1000 mg via ORAL
  Filled 2016-02-16: qty 4

## 2016-02-16 MED ORDER — CEFTRIAXONE SODIUM 1 G IJ SOLR
500.0000 mg | Freq: Once | INTRAMUSCULAR | Status: AC
  Administered 2016-02-16: 500 mg via INTRAMUSCULAR
  Filled 2016-02-16: qty 10

## 2016-02-16 NOTE — ED Provider Notes (Signed)
[facility]-EMERGENCY DEPT Provider Note   CSN: [id number] Arrival date & time: 02/16/16  1320    History   Chief Complaint Chief Complaint  Patient presents with  . Vaginal Itching  . Urinary Frequency    HPI Elaine Meyer is a 26 y.o. female.  26 year old female with no significant past medical history percent to the emergency department for evaluation of burning dysuria. Patient states that she has noticed this for one week. Dysuria is associated with urinary urgency. She has started to notice hematuria over the last 2 days. She denies passing any blood clots. Patient further reports some vaginal itching. She has tried using Monistat for this without relief. Patient also has taken AZO tablets for dysuria, but this has provided little improvement. Patient has been sexually active with one partner in the last 6 months. She denies the use of condoms. No associated fevers, abdominal pain, back pain, vaginal bleeding, nausea, or vomiting. No history of abdominal surgeries.   The history is provided by the patient. No language interpreter was used.    Past Medical History:  Diagnosis Date  . Abnormal Pap smear    HPV  . Vaginal Pap smear, abnormal     Patient Active Problem List   Diagnosis Date Noted  . Active labor 11/10/2013    Past Surgical History:  Procedure Laterality Date  . NO PAST SURGERIES      OB History    Gravida Para Term Preterm AB Living   2 2 2     2    SAB TAB Ectopic Multiple Live Births           2       Home Medications    Prior to Admission medications   Medication Sig Start Date End Date Taking? Authorizing Provider  cephALEXin (KEFLEX) 500 MG capsule Take 1 capsule (500 mg total) by mouth 4 (four) times daily. 02/16/16   Antony MaduraKelly Jeanni Allshouse, PA-C  Fe Fum-FePoly-FA-Vit C-Vit B3 (INTEGRA F) 125-1 MG CAPS Take 1 tablet by mouth daily. Patient not taking: Reported on 02/16/2016 05/28/13   Marlis EdelsonWalidah N Karim, CNM  ibuprofen (ADVIL,MOTRIN) 600 MG tablet Take  1 tablet (600 mg total) by mouth every 6 (six) hours. Patient not taking: Reported on 02/16/2016 11/12/13   Ethelda Chickaroline Roberts, MD  phenazopyridine (PYRIDIUM) 200 MG tablet Take 1 tablet (200 mg total) by mouth 3 (three) times daily as needed for pain. Patient not taking: Reported on 02/16/2016 05/01/15   Marily MemosJason Mesner, MD    Family History Family History  Problem Relation Age of Onset  . Anemia Mother   . Asthma Sister   . Anemia Sister   . Depression Sister   . Asthma Brother   . Alcohol abuse Neg Hx   . Arthritis Neg Hx   . Birth defects Neg Hx   . Cancer Neg Hx   . COPD Neg Hx   . Diabetes Neg Hx   . Drug abuse Neg Hx   . Early death Neg Hx   . Hearing loss Neg Hx   . Heart disease Neg Hx   . Hyperlipidemia Neg Hx   . Hypertension Neg Hx   . Kidney disease Neg Hx   . Learning disabilities Neg Hx   . Mental illness Neg Hx   . Mental retardation Neg Hx   . Miscarriages / Stillbirths Neg Hx   . Stroke Neg Hx   . Vision loss Neg Hx   . Other Neg Hx  Social History Social History  Substance Use Topics  . Smoking status: Never Smoker  . Smokeless tobacco: Never Used  . Alcohol use No     Allergies   Patient has no known allergies.   Review of Systems Review of Systems Ten systems reviewed and are negative for acute change, except as noted in the HPI.    Physical Exam Updated Vital Signs BP 107/66   Pulse 72   Temp 98.5 F (36.9 C) (Oral)   Resp 18   LMP 01/28/2016 (Approximate)   SpO2 100%   Physical Exam  Constitutional: She is oriented to person, place, and time. She appears well-developed and well-nourished. No distress.  Nontoxic appearing, pleasant.  HENT:  Head: Normocephalic and atraumatic.  Eyes: Conjunctivae and EOM are normal. No scleral icterus.  Neck: Normal range of motion.  Cardiovascular: Normal rate, regular rhythm and intact distal pulses.   Pulmonary/Chest: Effort normal. No respiratory distress. She has no wheezes.  Respirations even  and unlabored  Abdominal: Soft. She exhibits no distension. There is no tenderness. There is no guarding.  Soft, nontender abdomen.  Genitourinary:  Genitourinary Comments: White, thick vaginal discharge. There is cervical friability. No cervical motion tenderness or adnexal tenderness. Uterus is also nontender.  Musculoskeletal: Normal range of motion.  Neurological: She is alert and oriented to person, place, and time. She exhibits normal muscle tone. Coordination normal.  Ambulatory with steady gait.  Skin: Skin is warm and dry. No rash noted. She is not diaphoretic. No erythema. No pallor.  Psychiatric: She has a normal mood and affect. Her behavior is normal.  Nursing note and vitals reviewed.    ED Treatments / Results  Labs (all labs ordered are listed, but only abnormal results are displayed) Labs Reviewed  WET PREP, GENITAL - Abnormal; Notable for the following:       Result Value   Clue Cells Wet Prep HPF POC PRESENT (*)    WBC, Wet Prep HPF POC FEW (*)    All other components within normal limits  URINALYSIS, ROUTINE W REFLEX MICROSCOPIC - Abnormal; Notable for the following:    APPearance HAZY (*)    Hgb urine dipstick MODERATE (*)    Protein, ur 100 (*)    Leukocytes, UA MODERATE (*)    Bacteria, UA RARE (*)    Squamous Epithelial / LPF 0-5 (*)    All other components within normal limits  URINE CULTURE  POC URINE PREG, ED  GC/CHLAMYDIA PROBE AMP ([place]) NOT AT [facility]    EKG  EKG Interpretation None       Radiology No results found.  Procedures Procedures (including critical care time)  Medications Ordered in ED Medications  cefTRIAXone (ROCEPHIN) injection 500 mg (not administered)  azithromycin (ZITHROMAX) tablet 1,000 mg (1,000 mg Oral Given 02/16/16 2143)     Initial Impression / Assessment and Plan / ED Course  I have reviewed the triage vital signs and the nursing notes.  Pertinent labs & imaging results that were available during my  care of the patient were reviewed by me and considered in my medical decision making (see chart for details).  Clinical Course     Pt presenting for dysuria and urgency x 1 week. She has pyuria and hematuria today which is likely representative of hemorrhagic cystitis. Pt is afebrile, no CVA tenderness, and denies N/V. VSS. Pt to be discharged home with antibiotics and instructions to follow up with PCP if symptoms persist.   Final Clinical Impressions(s) /  ED Diagnoses   Final diagnoses:  Pyuria  Hematuria, unspecified type  History of unprotected sex    New Prescriptions New Prescriptions   CEPHALEXIN (KEFLEX) 500 MG CAPSULE    Take 1 capsule (500 mg total) by mouth 4 (four) times daily.     Antony Madura, PA-C 02/16/16 2150    Arby Barrette, MD 02/17/16 [phone]

## 2016-02-16 NOTE — ED Notes (Signed)
Pt verbalized understanding discharge instructions and denies any further needs or questions at this time. VS stable, ambulatory and steady gait.   

## 2016-02-16 NOTE — ED Triage Notes (Signed)
PT states for one week she has been having pain and burning with urination and now has itching inside vagina.  LMP: Late December

## 2016-02-16 NOTE — Discharge Instructions (Signed)
Follow-up on your STD test results in 2 days. Do not engage in sexual intercourse for one week. If you test positive for STDs, notify all sexual partners of their need to be tested and treated as well. Take Keflex as prescribed until finished. You may take Tylenol or ibuprofen as needed for pain.

## 2016-02-18 LAB — GC/CHLAMYDIA PROBE AMP (~~LOC~~) NOT AT ARMC
Chlamydia: NEGATIVE
Neisseria Gonorrhea: NEGATIVE

## 2016-02-19 LAB — URINE CULTURE: Culture: 60000 — AB

## 2016-02-20 ENCOUNTER — Telehealth (HOSPITAL_BASED_OUTPATIENT_CLINIC_OR_DEPARTMENT_OTHER): Payer: Self-pay

## 2016-02-20 NOTE — Telephone Encounter (Signed)
Post ED Visit - Positive Culture Follow-up  Culture report reviewed by antimicrobial stewardship pharmacist:  []  Enzo BiNathan Batchelder, Pharm.D. []  Celedonio MiyamotoJeremy Frens, Pharm.D., BCPS []  Garvin FilaMike Maccia, Pharm.D. []  Georgina PillionElizabeth Martin, Pharm.D., BCPS []  Huntington CenterMinh Pham, [address] Rainbow BoulevardPharm.D., BCPS, AAHIVP []  Estella HuskMichelle Turner, Pharm.D., BCPS, AAHIVP []  Tennis Mustassie Stewart, Pharm.D. []  Sherle Poeob Vincent, [place].Ophelia Shoulder. X  Margaret Shuda, Pharm.D.  Positive urine culture, 60,000 colonies -> E Coli Treated with Cephalexin, organism sensitive to the same and no further patient follow-up is required at this time.   Arvid RightClark, Elaine Meyer 02/20/2016, 11:23 AM

## 2019-03-31 ENCOUNTER — Encounter (HOSPITAL_COMMUNITY): Payer: Self-pay

## 2019-03-31 ENCOUNTER — Emergency Department (HOSPITAL_COMMUNITY)
Admission: EM | Admit: 2019-03-31 | Discharge: 2019-03-31 | Disposition: A | Discharge status: Home / Self Care | Payer: Self-pay | Attending: Emergency Medicine | Admitting: Emergency Medicine

## 2019-03-31 DIAGNOSIS — J029 Acute pharyngitis, unspecified: Secondary | ICD-10-CM

## 2019-03-31 DIAGNOSIS — J020 Streptococcal pharyngitis: Secondary | ICD-10-CM | POA: Insufficient documentation

## 2019-03-31 LAB — GROUP A STREP BY PCR: Group A Strep by PCR: DETECTED — AB

## 2019-03-31 MED ORDER — PENICILLIN V POTASSIUM 500 MG PO TABS: 500.0000 mg | ORAL_TABLET | Freq: Three times a day (TID) | ORAL | 0 refills | Status: DC

## 2019-03-31 MED ORDER — IBUPROFEN 800 MG PO TABS
800.0000 mg | ORAL_TABLET | Freq: Once | ORAL | Status: AC
  Administered 2019-03-31: 800 mg via ORAL
  Filled 2019-03-31: qty 1

## 2019-03-31 NOTE — Discharge Instructions (Addendum)
Please take acetaminophen and ibuprofen for pain. Take all antibiotics as prescribed. Drink plenty of fluids. Return if worsening pain, inability to swallow, or difficulty breathing.

## 2019-03-31 NOTE — ED Triage Notes (Signed)
Pt reports that she has had a sore throat for two days, denies fevers or sick contacts.

## 2019-03-31 NOTE — ED Provider Notes (Signed)
[name] EMERGENCY DEPARTMENT Provider Note   CSN: [id number] Arrival date & time: 03/31/19  6301     History Chief Complaint  Patient presents with  . Sore Throat    Elaine Meyer is a 29 y.o. female.  HPI  29 yo female presents with co sore throat x 2 days with tonsillar swelling and exudate.  Chills but no definite fever.     Past Medical History:  Diagnosis Date  . Abnormal Pap smear    HPV  . Vaginal Pap smear, abnormal     Patient Active Problem List   Diagnosis Date Noted  . Active labor 11/10/2013    Past Surgical History:  Procedure Laterality Date  . NO PAST SURGERIES       OB History    Gravida  2   Para  2   Term  2   Preterm      AB      Living  2     SAB      TAB      Ectopic      Multiple      Live Births  2           Family History  Problem Relation Age of Onset  . Anemia Mother   . Asthma Sister   . Anemia Sister   . Depression Sister   . Asthma Brother   . Alcohol abuse Neg Hx   . Arthritis Neg Hx   . Birth defects Neg Hx   . Cancer Neg Hx   . COPD Neg Hx   . Diabetes Neg Hx   . Drug abuse Neg Hx   . Early death Neg Hx   . Hearing loss Neg Hx   . Heart disease Neg Hx   . Hyperlipidemia Neg Hx   . Hypertension Neg Hx   . Kidney disease Neg Hx   . Learning disabilities Neg Hx   . Mental illness Neg Hx   . Mental retardation Neg Hx   . Miscarriages / Stillbirths Neg Hx   . Stroke Neg Hx   . Vision loss Neg Hx   . Other Neg Hx     Social History   Tobacco Use  . Smoking status: Never Smoker  . Smokeless tobacco: Never Used  Substance Use Topics  . Alcohol use: No  . Drug use: No    Home Medications Prior to Admission medications   Medication Sig Start Date End Date Taking? Authorizing Provider  cephALEXin (KEFLEX) 500 MG capsule Take 1 capsule (500 mg total) by mouth 4 (four) times daily. 02/16/16   Antonietta Breach, PA-C  Fe Fum-FePoly-FA-Vit C-Vit B3 (INTEGRA F)  125-1 MG CAPS Take 1 tablet by mouth daily. Patient not taking: Reported on 02/16/2016 05/28/13   Cyndee Brightly, CNM  ibuprofen (ADVIL,MOTRIN) 600 MG tablet Take 1 tablet (600 mg total) by mouth every 6 (six) hours. Patient not taking: Reported on 02/16/2016 11/12/13   Josephine Cables, MD  phenazopyridine (PYRIDIUM) 200 MG tablet Take 1 tablet (200 mg total) by mouth 3 (three) times daily as needed for pain. Patient not taking: Reported on 02/16/2016 05/01/15   Mesner, Corene Cornea, MD    Allergies    Patient has no known allergies.  Review of Systems   Review of Systems  All other systems reviewed and are negative.   Physical Exam Updated Vital Signs BP 119/65 (BP Location: Right Arm)   Pulse (!) 105  Temp 98.5 F (36.9 C) (Oral)   Resp 20   SpO2 100%   Physical Exam Vitals reviewed.  HENT:     Head: Normocephalic.     Right Ear: Ear canal normal.     Left Ear: Ear canal normal.     Mouth/Throat:     Mouth: Mucous membranes are moist.     Pharynx: Uvula midline. Pharyngeal swelling and oropharyngeal exudate present. No uvula swelling.  Eyes:     Conjunctiva/sclera: Conjunctivae normal.  Cardiovascular:     Rate and Rhythm: Normal rate and regular rhythm.     Heart sounds: Normal heart sounds.  Pulmonary:     Effort: Pulmonary effort is normal.     Breath sounds: Normal breath sounds.  Abdominal:     Palpations: Abdomen is soft.  Musculoskeletal:     Cervical back: Normal range of motion and neck supple.  Skin:    General: Skin is warm.     Capillary Refill: Capillary refill takes less than 2 seconds.  Neurological:     General: No focal deficit present.     Mental Status: She is alert.  Psychiatric:        Mood and Affect: Mood normal.     ED Results / Procedures / Treatments   Labs (all labs ordered are listed, but only abnormal results are displayed) Labs Reviewed  GROUP A STREP BY PCR    EKG None  Radiology No results  found.  Procedures Procedures (including critical care time)  Medications Ordered in ED Medications - No data to display  ED Course  I have reviewed the triage vital signs and the nursing notes.  Pertinent labs & imaging results that were available during my care of the patient were reviewed by me and considered in my medical decision making (see chart for details).    MDM Rules/Calculators/A&P                     Final Clinical Impression(s) / ED Diagnoses Final diagnoses:  Pharyngitis, unspecified etiology    Rx / DC Orders ED Discharge Orders    None       Margarita Grizzle, MD 03/31/19 [phone]

## 2019-08-01 ENCOUNTER — Inpatient Hospital Stay (HOSPITAL_COMMUNITY)
Admission: AD | Admit: 2019-08-01 | Discharge: 2019-08-01 | Disposition: A | Discharge status: Home / Self Care | Payer: Self-pay | Attending: Obstetrics and Gynecology | Admitting: Obstetrics and Gynecology

## 2019-08-01 ENCOUNTER — Inpatient Hospital Stay (HOSPITAL_COMMUNITY): Payer: Self-pay

## 2019-08-01 ENCOUNTER — Other Ambulatory Visit: Payer: Self-pay

## 2019-08-01 ENCOUNTER — Encounter (HOSPITAL_COMMUNITY): Payer: Self-pay | Admitting: Obstetrics and Gynecology

## 2019-08-01 DIAGNOSIS — O209 Hemorrhage in early pregnancy, unspecified: Secondary | ICD-10-CM | POA: Insufficient documentation

## 2019-08-01 DIAGNOSIS — Z79899 Other long term (current) drug therapy: Secondary | ICD-10-CM | POA: Insufficient documentation

## 2019-08-01 DIAGNOSIS — Z791 Long term (current) use of non-steroidal anti-inflammatories (NSAID): Secondary | ICD-10-CM | POA: Insufficient documentation

## 2019-08-01 DIAGNOSIS — R109 Unspecified abdominal pain: Secondary | ICD-10-CM | POA: Insufficient documentation

## 2019-08-01 DIAGNOSIS — O26851 Spotting complicating pregnancy, first trimester: Secondary | ICD-10-CM

## 2019-08-01 DIAGNOSIS — O3680X0 Pregnancy with inconclusive fetal viability, not applicable or unspecified: Secondary | ICD-10-CM

## 2019-08-01 DIAGNOSIS — O26891 Other specified pregnancy related conditions, first trimester: Secondary | ICD-10-CM | POA: Insufficient documentation

## 2019-08-01 DIAGNOSIS — Z3A01 Less than 8 weeks gestation of pregnancy: Secondary | ICD-10-CM | POA: Insufficient documentation

## 2019-08-01 LAB — URINALYSIS, ROUTINE W REFLEX MICROSCOPIC
Bilirubin Urine: NEGATIVE
Glucose, UA: NEGATIVE mg/dL
Ketones, ur: NEGATIVE mg/dL
Leukocytes,Ua: NEGATIVE
Nitrite: NEGATIVE
Protein, ur: NEGATIVE mg/dL
Specific Gravity, Urine: 1.03 (ref 1.005–1.030)
pH: 5 (ref 5.0–8.0)

## 2019-08-01 LAB — CBC
HCT: 37.6 % (ref 36.0–46.0)
Hemoglobin: 11.9 g/dL — ABNORMAL LOW (ref 12.0–15.0)
MCH: 30.2 pg (ref 26.0–34.0)
MCHC: 31.6 g/dL (ref 30.0–36.0)
MCV: 95.4 fL (ref 80.0–100.0)
Platelets: 226 10*3/uL (ref 150–400)
RBC: 3.94 MIL/uL (ref 3.87–5.11)
RDW: 12.7 % (ref 11.5–15.5)
WBC: 9 10*3/uL (ref 4.0–10.5)
nRBC: 0 % (ref 0.0–0.2)

## 2019-08-01 LAB — HCG, QUANTITATIVE, PREGNANCY: hCG, Beta Chain, Quant, S: 6472 m[IU]/mL — ABNORMAL HIGH (ref <5)

## 2019-08-01 LAB — WET PREP, GENITAL
Clue Cells Wet Prep HPF POC: NONE SEEN
Sperm: NONE SEEN
Trich, Wet Prep: NONE SEEN
Yeast Wet Prep HPF POC: NONE SEEN

## 2019-08-01 LAB — POCT PREGNANCY, URINE: Preg Test, Ur: POSITIVE — AB

## 2019-08-01 NOTE — MAU Provider Note (Signed)
Chief Complaint: Vaginal Bleeding and Abdominal Cramping   First Provider Initiated Contact with Patient 08/01/19 2217        SUBJECTIVE HPI: Elaine Meyer is a 29 y.o. G3P2002 at [redacted]w[redacted]d by LMP who presents to maternity admissions reporting light bleeding since Monday.  Has turned to brown.  Did report having had intercourse prior to first episode.  Has some mild cramping.. She denies  vaginal itching/burning, urinary symptoms, h/a, dizziness, n/v, or fever/chills.    Vaginal Bleeding The patient's primary symptoms include pelvic pain and vaginal bleeding. The patient's pertinent negatives include no genital itching, genital lesions or genital odor. This is a new problem. The current episode started in the past 7 days. The problem occurs intermittently. The problem has been gradually improving. The pain is mild. She is pregnant. Pertinent negatives include no chills, constipation, diarrhea, fever, frequency, nausea or vomiting. The vaginal discharge was brown and bloody. The vaginal bleeding is spotting. She has not been passing clots. She has not been passing tissue. Nothing aggravates the symptoms. She has tried nothing for the symptoms. She is sexually active. It is unknown whether or not her partner has an STD.  Abdominal Cramping This is a new problem. The current episode started in the past 7 days. The onset quality is gradual. The problem occurs intermittently. Pertinent negatives include no constipation, diarrhea, fever, frequency, nausea or vomiting. Nothing aggravates the pain. The pain is relieved by nothing. She has tried nothing for the symptoms.   RN Note: Had positive HPT on June 12. On Monday, she saw red blood when wiping. Now has brown blood when wiping. Also has mild cramps. LMP was on Jun 10, 2019. Had intercourse before bleeding started.   Past Medical History:  Diagnosis Date  . Abnormal Pap smear    HPV  . Vaginal Pap smear, abnormal    Past Surgical History:   Procedure Laterality Date  . NO PAST SURGERIES     Social History   Socioeconomic History  . Marital status: Single    Spouse name: Not on file  . Number of children: Not on file  . Years of education: Not on file  . Highest education level: Not on file  Occupational History  . Not on file  Tobacco Use  . Smoking status: Never Smoker  . Smokeless tobacco: Never Used  Substance and Sexual Activity  . Alcohol use: No  . Drug use: No  . Sexual activity: Yes    Birth control/protection: None  Other Topics Concern  . Not on file  Social History Narrative  . Not on file   Social Determinants of Health   Financial Resource Strain:   . Difficulty of Paying Living Expenses:   Food Insecurity:   . Worried About Programme researcher, broadcasting/film/video in the Last Year:   . Barista in the Last Year:   Transportation Needs:   . Freight forwarder (Medical):   Marland Kitchen Lack of Transportation (Non-Medical):   Physical Activity:   . Days of Exercise per Week:   . Minutes of Exercise per Session:   Stress:   . Feeling of Stress :   Social Connections:   . Frequency of Communication with Friends and Family:   . Frequency of Social Gatherings with Friends and Family:   . Attends Religious Services:   . Active Member of Clubs or Organizations:   . Attends Banker Meetings:   Marland Kitchen Marital Status:   Intimate Programme researcher, broadcasting/film/video  Violence:   . Fear of Current or Ex-Partner:   . Emotionally Abused:   Marland Kitchen Physically Abused:   . Sexually Abused:    No current facility-administered medications on file prior to encounter.   Current Outpatient Medications on File Prior to Encounter  Medication Sig Dispense Refill  . Docosahexaenoic Acid (PRENATAL DHA) 200 MG CAPS Take by mouth.    . cephALEXin (KEFLEX) 500 MG capsule Take 1 capsule (500 mg total) by mouth 4 (four) times daily. 14 capsule 0  . Fe Fum-FePoly-FA-Vit C-Vit B3 (INTEGRA F) 125-1 MG CAPS Take 1 tablet by mouth daily. (Patient not taking:  Reported on 02/16/2016) 30 capsule 1  . ibuprofen (ADVIL,MOTRIN) 600 MG tablet Take 1 tablet (600 mg total) by mouth every 6 (six) hours. (Patient not taking: Reported on 02/16/2016) 30 tablet 0  . penicillin v potassium (VEETID) 500 MG tablet Take 1 tablet (500 mg total) by mouth 3 (three) times daily. 30 tablet 0  . phenazopyridine (PYRIDIUM) 200 MG tablet Take 1 tablet (200 mg total) by mouth 3 (three) times daily as needed for pain. (Patient not taking: Reported on 02/16/2016) 6 tablet 0   No Known Allergies  I have reviewed patient's Past Medical Hx, Surgical Hx, Family Hx, Social Hx, medications and allergies.   ROS:  Review of Systems  Constitutional: Negative for chills and fever.  Gastrointestinal: Negative for constipation, diarrhea, nausea and vomiting.  Genitourinary: Positive for pelvic pain and vaginal bleeding. Negative for frequency.   Review of Systems  Other systems negative   Physical Exam  Physical Exam Patient Vitals for the past 24 hrs:  BP Temp Temp src Pulse Resp SpO2 Height Weight  08/01/19 2122 (!) 124/57 98.3 F (36.8 C) Oral 78 16 98 % 5\' 3"  (1.6 m) 79 kg   Constitutional: Well-developed, well-nourished female in no acute distress.  Cardiovascular: normal rate Respiratory: normal effort GI: Abd soft, non-tender. Pos BS x 4 MS: Extremities nontender, no edema, normal ROM Neurologic: Alert and oriented x 4.  GU: Neg CVAT.  PELVIC EXAM: Cervix pink, visually closed, without lesion, scant white creamy discharge, vaginal walls and external genitalia normal   No blood visible Bimanual exam: Cervix 0/long/high, firm, anterior, neg CMT, uterus nontender, nonenlarged, adnexa without tenderness, enlargement, or mass   LAB RESULTS Results for orders placed or performed during the hospital encounter of 08/01/19 (from the past 24 hour(s))  Urinalysis, Routine w reflex microscopic     Status: Abnormal   Collection Time: 08/01/19  9:32 PM  Result Value Ref Range    Color, Urine YELLOW YELLOW   APPearance HAZY (A) CLEAR   Specific Gravity, Urine 1.030 1.005 - 1.030   pH 5.0 5.0 - 8.0   Glucose, UA NEGATIVE NEGATIVE mg/dL   Hgb urine dipstick SMALL (A) NEGATIVE   Bilirubin Urine NEGATIVE NEGATIVE   Ketones, ur NEGATIVE NEGATIVE mg/dL   Protein, ur NEGATIVE NEGATIVE mg/dL   Nitrite NEGATIVE NEGATIVE   Leukocytes,Ua NEGATIVE NEGATIVE   RBC / HPF 0-5 0 - 5 RBC/hpf   WBC, UA 0-5 0 - 5 WBC/hpf   Bacteria, UA RARE (A) NONE SEEN   Squamous Epithelial / LPF 6-10 0 - 5   Mucus PRESENT   Pregnancy, urine POC     Status: Abnormal   Collection Time: 08/01/19  9:33 PM  Result Value Ref Range   Preg Test, Ur POSITIVE (A) NEGATIVE  CBC     Status: Abnormal   Collection Time: 08/01/19  9:41 PM  Result Value Ref Range   WBC 9.0 4.0 - 10.5 K/uL   RBC 3.94 3.87 - 5.11 MIL/uL   Hemoglobin 11.9 (L) 12.0 - 15.0 g/dL   HCT 72.0 36 - 46 %   MCV 95.4 80.0 - 100.0 fL   MCH 30.2 26.0 - 34.0 pg   MCHC 31.6 30.0 - 36.0 g/dL   RDW 94.7 09.6 - 28.3 %   Platelets 226 150 - 400 K/uL   nRBC 0.0 0.0 - 0.2 %  hCG, quantitative, pregnancy     Status: Abnormal   Collection Time: 08/01/19  9:41 PM  Result Value Ref Range   hCG, Beta Chain, Quant, S 6,472 (H) <5 mIU/mL  Wet prep, genital     Status: Abnormal   Collection Time: 08/01/19 10:22 PM   Specimen: PATH Cytology Cervicovaginal Ancillary Only  Result Value Ref Range   Yeast Wet Prep HPF POC NONE SEEN NONE SEEN   Trich, Wet Prep NONE SEEN NONE SEEN   Clue Cells Wet Prep HPF POC NONE SEEN NONE SEEN   WBC, Wet Prep HPF POC MODERATE (A) NONE SEEN   Sperm NONE SEEN    Blood Type O+  IMAGING No results found.  MAU Management/MDM: Ordered usual first trimester r/o ectopic labs.   Pelvic exam and cultures done Will check baseline Ultrasound to rule out ectopic.  This bleeding/pain can represent a normal pregnancy with bleeding, spontaneous abortion or even an ectopic which can be life-threatening.  The process as  listed above helps to determine which of these is present.  Reviewed results showed Single gestational sac Absence of YS or embryo make this inconclusive Recommend repeat HCG in 48 hours (Friday night or Sat am) then repeat US if numbers double appropriately  ASSESSMENT Pregnancy at [redacted]w[redacted]d by LMP Spotting in early pregnancy Pregnancy ofunknown anatomic location  PLAN Discharge home Plan to repeat HCG level in 48 hours in MAU Will repeat  Ultrasound in about 7-10 days if HCG levels double appropriately  Ectopic precautions   Pt stable at time of discharge. Encouraged to return here or to other Urgent Care/ED if she develops worsening of symptoms, increase in pain, fever, or other concerning symptoms.    Wynelle Bourgeois CNM, MSN Certified Nurse-Midwife 08/01/2019  10:17 PM

## 2019-08-01 NOTE — Discharge Instructions (Signed)
Vaginal Bleeding During Pregnancy, First Trimester  A small amount of bleeding from the vagina (spotting) is relatively common during early pregnancy. It usually stops on its own. Various things may cause bleeding or spotting during early pregnancy. Some bleeding may be related to the pregnancy, and some may not. In many cases, the bleeding is normal and is not a problem. However, bleeding can also be a sign of something serious. Be sure to tell your health care provider about any vaginal bleeding right away. Some possible causes of vaginal bleeding during the first trimester include:  Infection or inflammation of the cervix.  Growths (polyps) on the cervix.  Miscarriage or threatened miscarriage.  Pregnancy tissue developing outside of the uterus (ectopic pregnancy).  A mass of tissue developing in the uterus due to an egg being fertilized incorrectly (molar pregnancy). Follow these instructions at home: Activity  Follow instructions from your health care provider about limiting your activity. Ask what activities are safe for you.  If needed, make plans for someone to help with your regular activities.  Do not have sex or orgasms until your health care provider says that this is safe. General instructions  Take over-the-counter and prescription medicines only as told by your health care provider.  Pay attention to any changes in your symptoms.  Do not use tampons or douche.  Write down how many pads you use each day, how often you change pads, and how soaked (saturated) they are.  If you pass any tissue from your vagina, save the tissue so you can show it to your health care provider.  Keep all follow-up visits as told by your health care provider. This is important. Contact a health care provider if:  You have vaginal bleeding during any part of your pregnancy.  You have cramps or labor pains.  You have a fever. Get help right away if:  You have severe cramps in your  back or abdomen.  You pass large clots or a large amount of tissue from your vagina.  Your bleeding increases.  You feel light-headed or weak, or you faint.  You have chills.  You are leaking fluid or have a gush of fluid from your vagina. Summary  A small amount of bleeding (spotting) from the vagina is relatively common during early pregnancy.  Various things may cause bleeding or spotting in early pregnancy.  Be sure to tell your health care provider about any vaginal bleeding right away. This information is not intended to replace advice given to you by your health care provider. Make sure you discuss any questions you have with your health care provider. Document Revised: 05/16/2018 Document Reviewed: 04/29/2016 Elsevier Patient Education  2020 Elsevier Inc.  

## 2019-08-01 NOTE — MAU Note (Addendum)
Had positive HPT on June 12. On Monday, she saw red blood when wiping. Now has brown blood when wiping. Also has mild cramps. LMP was on Jun 10, 2019. Had intercourse before bleeding started.

## 2019-08-02 LAB — GC/CHLAMYDIA PROBE AMP (~~LOC~~) NOT AT ARMC
Chlamydia: NEGATIVE
Comment: NEGATIVE
Comment: NORMAL
Neisseria Gonorrhea: NEGATIVE

## 2019-08-02 LAB — HIV ANTIBODY (ROUTINE TESTING W REFLEX): HIV Screen 4th Generation wRfx: NONREACTIVE

## 2019-08-08 ENCOUNTER — Inpatient Hospital Stay (HOSPITAL_COMMUNITY)
Admission: AD | Admit: 2019-08-08 | Discharge: 2019-08-08 | Disposition: A | Discharge status: Home / Self Care | Payer: Self-pay | Attending: Obstetrics & Gynecology | Admitting: Obstetrics & Gynecology

## 2019-08-08 DIAGNOSIS — O26851 Spotting complicating pregnancy, first trimester: Secondary | ICD-10-CM

## 2019-08-08 DIAGNOSIS — Z3200 Encounter for pregnancy test, result unknown: Secondary | ICD-10-CM | POA: Insufficient documentation

## 2019-08-08 DIAGNOSIS — Z3A01 Less than 8 weeks gestation of pregnancy: Secondary | ICD-10-CM

## 2019-08-08 DIAGNOSIS — Z6790 Unspecified blood type, Rh positive: Secondary | ICD-10-CM

## 2019-08-08 DIAGNOSIS — O3680X0 Pregnancy with inconclusive fetal viability, not applicable or unspecified: Secondary | ICD-10-CM

## 2019-08-08 LAB — HCG, QUANTITATIVE, PREGNANCY: hCG, Beta Chain, Quant, S: 3590 m[IU]/mL — ABNORMAL HIGH (ref <5)

## 2019-08-08 NOTE — MAU Provider Note (Signed)
[name]  is a 29 y.o. G3P2002 at [redacted]w[redacted]d who presents to MAU today for follow-up quant hCG. The patient was seen in MAU on 08/01/19 and had quant hCG of 6472 and US showed IUGS but no YS or FP. She was to return on 08/03/19 but did not. She reports occasional pain, and scant spotting when she wipes.  OB History  Gravida Para Term Preterm AB Living  3 2 2     2   SAB TAB Ectopic Multiple Live Births          2    # Outcome Date GA Lbr Len/2nd Weight Sex Delivery Anes PTL Lv  3 Current           2 Term 11/11/13 [redacted]w[redacted]d 05:52 / 00:33 3855 g F Vag-Spont EPI  LIV  1 Term 01/25/12 [redacted]w[redacted]d 00:11 / 00:44 3500 g F Vag-Spont EPI  LIV     Birth Comments: WNL    Past Medical History:  Diagnosis Date  . Abnormal Pap smear    HPV  . Vaginal Pap smear, abnormal    ROS: + VB + pain, occasional  BP 122/75   Pulse 78   Temp 98.3 F (36.8 C)   Resp 16   LMP 06/10/2019   SpO2 100%   CONSTITUTIONAL: Well-developed, well-nourished female in no acute distress.  MUSCULOSKELETAL: Normal range of motion.  CARDIOVASCULAR: Regular heart rate RESPIRATORY: Normal effort NEUROLOGICAL: Alert and oriented to person, place, and time.  SKIN: Not diaphoretic. No erythema. No pallor. PSYCH: Normal mood and affect. Normal behavior. Normal judgment and thought content.  Results for orders placed or performed during the hospital encounter of 08/08/19 (from the past 24 hour(s))  hCG, quantitative, pregnancy     Status: Abnormal   Collection Time: 08/08/19  5:17 PM  Result Value Ref Range   hCG, Beta Chain, Quant, S 3,590 (H) <5 mIU/mL   MDM: Labs drawn, will call pt with results. Consult with Dr. 08/10/19, recommends repeat qhcg in 5 or more days. 1840: Notified pt of results, likely failed pregnancy, and need for f/u. Appt scheduled and information provided to pt.   A: 1. Pregnancy, location unknown   2. Blood type, Rh positive    P: Discharge home Return precautions discussed Follow up at  [facility] on 08/14/19 @1400  Patient may return to MAU as needed or if her condition were to change or worsen   Allergies as of 08/08/2019   No Known Allergies     Medication List    TAKE these medications   PreNatal DHA 200 MG Caps Generic drug: Docosahexaenoic Acid Take by mouth.       , CNM 08/08/2019 6:50 PM

## 2019-08-08 NOTE — MAU Note (Signed)
.   Elaine Meyer is a 28 y.o. at [redacted]w[redacted]d here in MAU reporting: she is here for repeat labs, she states that she was suppose to follow up on Saturday but was still having vaginal bleeding. Denies any pain LMP: 06/10/19 Onset of complaint: ongoing Pain score: 0 Vitals:   08/08/19 1713  BP: 122/75  Pulse: 78  Resp: 16  Temp: 98.3 F (36.8 C)  SpO2: 100%     FHT: Lab orders placed from triage: BHCG

## 2019-08-14 ENCOUNTER — Ambulatory Visit: Payer: Self-pay

## 2020-02-09 NOTE — L&D Delivery Note (Signed)
OB/GYN Faculty Practice Delivery Note  [name] is a 30 y.o. [phone] s/p SVD at [redacted]w[redacted]d. She was admitted for IOL for postdates.  ROM: 15h 46m with clear fluid; thick meconium stained fluid at delivery GBS Status: Negative   Delivery Date/Time: 12/03/20 at 0653  Delivery: Called to room and patient was complete and pushing. Head delivered LOA. A shoulder dystocia was encountered and no additional traction was placed on the infant's head. McRobert's maneuver and suprapubic pressure were performed in addition to rotational maneuvers which allowed for release of the anterior shoulder. The shoulders and body subsequently delivered in usual fashion. Cord clamped x 2 and cut immediately and infant was handed off to awaiting neonatology team. Cord blood and cord gas drawn. Placenta delivered spontaneously with gentle cord traction. Fundus firm with massage and Pitocin. Labia, perineum, vagina, and cervix were inspected, and no lacerations were noted.   Placenta: Intact; 3VC - sent to L&D Complications: Shoulder dystocia  Lacerations: None  EBL: 75 cc Analgesia: Epidural  Infant: Viable female  APGARs 5, 6, and 9  Weight 4310 g  Evalina Field, MD OB/GYN Fellow, Faculty Practice

## 2020-04-05 ENCOUNTER — Other Ambulatory Visit: Payer: Self-pay

## 2020-04-05 ENCOUNTER — Inpatient Hospital Stay (HOSPITAL_COMMUNITY)
Admission: AD | Admit: 2020-04-05 | Discharge: 2020-04-06 | Disposition: A | Discharge status: Home / Self Care | Payer: Self-pay | Attending: Obstetrics and Gynecology | Admitting: Obstetrics and Gynecology

## 2020-04-05 ENCOUNTER — Encounter (HOSPITAL_COMMUNITY): Payer: Self-pay

## 2020-04-05 DIAGNOSIS — O26891 Other specified pregnancy related conditions, first trimester: Secondary | ICD-10-CM | POA: Insufficient documentation

## 2020-04-05 DIAGNOSIS — O219 Vomiting of pregnancy, unspecified: Secondary | ICD-10-CM

## 2020-04-05 DIAGNOSIS — Z3A01 Less than 8 weeks gestation of pregnancy: Secondary | ICD-10-CM | POA: Insufficient documentation

## 2020-04-05 DIAGNOSIS — O3680X0 Pregnancy with inconclusive fetal viability, not applicable or unspecified: Secondary | ICD-10-CM

## 2020-04-05 DIAGNOSIS — R109 Unspecified abdominal pain: Secondary | ICD-10-CM | POA: Insufficient documentation

## 2020-04-05 DIAGNOSIS — R110 Nausea: Secondary | ICD-10-CM | POA: Insufficient documentation

## 2020-04-05 LAB — CBC
HCT: 37.5 % (ref 36.0–46.0)
Hemoglobin: 12.2 g/dL (ref 12.0–15.0)
MCH: 30 pg (ref 26.0–34.0)
MCHC: 32.5 g/dL (ref 30.0–36.0)
MCV: 92.4 fL (ref 80.0–100.0)
Platelets: 247 10*3/uL (ref 150–400)
RBC: 4.06 MIL/uL (ref 3.87–5.11)
RDW: 13.2 % (ref 11.5–15.5)
WBC: 9.1 10*3/uL (ref 4.0–10.5)
nRBC: 0 % (ref 0.0–0.2)

## 2020-04-05 LAB — URINALYSIS, ROUTINE W REFLEX MICROSCOPIC
Bilirubin Urine: NEGATIVE
Glucose, UA: NEGATIVE mg/dL
Hgb urine dipstick: NEGATIVE
Ketones, ur: NEGATIVE mg/dL
Leukocytes,Ua: NEGATIVE
Nitrite: NEGATIVE
Protein, ur: NEGATIVE mg/dL
Specific Gravity, Urine: 1.021 (ref 1.005–1.030)
pH: 5 (ref 5.0–8.0)

## 2020-04-05 LAB — POCT PREGNANCY, URINE: Preg Test, Ur: POSITIVE — AB

## 2020-04-05 MED ORDER — METOCLOPRAMIDE HCL 10 MG PO TABS
10.0000 mg | ORAL_TABLET | Freq: Once | ORAL | Status: AC
  Administered 2020-04-05: 10 mg via ORAL
  Filled 2020-04-05: qty 1

## 2020-04-05 NOTE — MAU Provider Note (Signed)
History     CSN: [id number]  Arrival date and time: 04/05/20 2201   Event Date/Time   First Provider Initiated Contact with Patient 04/05/20 2334      Chief Complaint  Patient presents with  . Abdominal Pain  . Nausea   Ma Lawerance Sabal Angelica Chessman is a 30 y.o. [id number] at 6.5 weeks by Definite LMP of Jan 10th.  She presents today for Abdominal Pain and Nausea.  She states she took a pregnancy test 2-3 weeks ago and it has been experiencing nausea since.  She states she is sometimes unable to keep down food.  She reports she has "not really" been able to eat today. She states she has tried eating fruit and was able to keep it down.  She states she has to wait until the nausea subsides before attempting to eat. Patient also states she has lower abdominal pain that is sharp and is usually on the right.  She states she experiences the pain daily, multiple times per day.  She states the pain lasts "some seconds."  She states the pain is random and has no known relieving factors.    OB History    Gravida  3   Para  2   Term  2   Preterm      AB  1   Living  2     SAB  1   IAB      Ectopic      Multiple      Live Births  2           Past Medical History:  Diagnosis Date  . Abnormal Pap smear    HPV  . Vaginal Pap smear, abnormal     Past Surgical History:  Procedure Laterality Date  . NO PAST SURGERIES      Family History  Problem Relation Age of Onset  . Anemia Mother   . Asthma Sister   . Anemia Sister   . Depression Sister   . Asthma Brother   . Alcohol abuse Neg Hx   . Arthritis Neg Hx   . Birth defects Neg Hx   . Cancer Neg Hx   . COPD Neg Hx   . Diabetes Neg Hx   . Drug abuse Neg Hx   . Early death Neg Hx   . Hearing loss Neg Hx   . Heart disease Neg Hx   . Hyperlipidemia Neg Hx   . Hypertension Neg Hx   . Kidney disease Neg Hx   . Learning disabilities Neg Hx   . Mental illness Neg Hx   . Mental retardation Neg Hx   . Miscarriages /  Stillbirths Neg Hx   . Stroke Neg Hx   . Vision loss Neg Hx   . Other Neg Hx     Social History   Tobacco Use  . Smoking status: Never Smoker  . Smokeless tobacco: Never Used  Substance Use Topics  . Alcohol use: No  . Drug use: No    Allergies: No Known Allergies  Medications Prior to Admission  Medication Sig Dispense Refill Last Dose  . Docosahexaenoic Acid (PRENATAL DHA) 200 MG CAPS Take by mouth.       Review of Systems  Constitutional: Negative for chills and fever.  Respiratory: Negative for cough and shortness of breath.   Gastrointestinal: Positive for abdominal pain and nausea. Negative for vomiting.  Genitourinary: Negative for difficulty urinating, dysuria, pelvic pain, vaginal bleeding and vaginal discharge.  Neurological: Positive for light-headedness. Negative for dizziness and headaches.   Physical Exam   Blood pressure 118/65, pulse 75, temperature 98.2 F (36.8 C), temperature source Oral, resp. rate 16, height 5\' 4"  (1.626 m), weight 85.6 kg, SpO2 100 %, unknown if currently breastfeeding.  Physical Exam Vitals reviewed.  Constitutional:      Appearance: Normal appearance. She is well-developed.  HENT:     Head: Normocephalic and atraumatic.  Eyes:     Conjunctiva/sclera: Conjunctivae normal.  Cardiovascular:     Rate and Rhythm: Normal rate and regular rhythm.     Heart sounds: Normal heart sounds.  Pulmonary:     Effort: Pulmonary effort is normal. No respiratory distress.     Breath sounds: Normal breath sounds.  Abdominal:     General: Bowel sounds are normal.     Palpations: Abdomen is soft.     Tenderness: There is no abdominal tenderness.  Musculoskeletal:        General: Normal range of motion.     Cervical back: Normal range of motion.  Skin:    General: Skin is warm and dry.  Neurological:     Mental Status: She is alert and oriented to person, place, and time.  Psychiatric:        Mood and Affect: Mood normal.         Behavior: Behavior normal.        Thought Content: Thought content normal.     MAU Course  Procedures Results for orders placed or performed during the hospital encounter of 04/05/20 (from the past 24 hour(s))  Urinalysis, Routine w reflex microscopic Urine, Clean Catch     Status: Abnormal   Collection Time: 04/05/20 10:36 PM  Result Value Ref Range   Color, Urine YELLOW YELLOW   APPearance HAZY (A) CLEAR   Specific Gravity, Urine 1.021 1.005 - 1.030   pH 5.0 5.0 - 8.0   Glucose, UA NEGATIVE NEGATIVE mg/dL   Hgb urine dipstick NEGATIVE NEGATIVE   Bilirubin Urine NEGATIVE NEGATIVE   Ketones, ur NEGATIVE NEGATIVE mg/dL   Protein, ur NEGATIVE NEGATIVE mg/dL   Nitrite NEGATIVE NEGATIVE   Leukocytes,Ua NEGATIVE NEGATIVE  Pregnancy, urine POC     Status: Abnormal   Collection Time: 04/05/20 10:39 PM  Result Value Ref Range   Preg Test, Ur POSITIVE (A) NEGATIVE  CBC     Status: None   Collection Time: 04/05/20 11:18 PM  Result Value Ref Range   WBC 9.1 4.0 - 10.5 K/uL   RBC 4.06 3.87 - 5.11 MIL/uL   Hemoglobin 12.2 12.0 - 15.0 g/dL   HCT 04/07/20 16.1 - 09.6 %   MCV 92.4 80.0 - 100.0 fL   MCH 30.0 26.0 - 34.0 pg   MCHC 32.5 30.0 - 36.0 g/dL   RDW 04.5 40.9 - 81.1 %   Platelets 247 150 - 400 K/uL   nRBC 0.0 0.0 - 0.2 %  Wet prep, genital     Status: Abnormal   Collection Time: 04/05/20 11:57 PM  Result Value Ref Range   Yeast Wet Prep HPF POC NONE SEEN NONE SEEN   Trich, Wet Prep NONE SEEN NONE SEEN   Clue Cells Wet Prep HPF POC NONE SEEN NONE SEEN   WBC, Wet Prep HPF POC MODERATE (A) NONE SEEN   Sperm NONE SEEN    04/07/20 OB Comp Less 14 Wks  Result Date: 04/06/2020 CLINICAL DATA:  Pregnancy of unknown location EXAM: OBSTETRIC <14 WK ULTRASOUND TECHNIQUE: Transabdominal ultrasound  was performed for evaluation of the gestation as well as the maternal uterus and adnexal regions. COMPARISON:  None. FINDINGS: Intrauterine gestational sac: Single Yolk sac:  Visualized. Embryo:   Visualized. Cardiac Activity: Visualized. Heart Rate: 127 bpm CRL:   7.5 mm   6 w 4 d                  [place] EDC: 11/26/2020 Subchorionic hemorrhage: Small subchorionic hemorrhage is seen. Maternal uterus/adnexae:   Normal appearing ovaries. IMPRESSION: Single live intrauterine pregnancy measuring 6 weeks 4 days Electronically Signed   By: Jonna Clark M.D.   On: 04/06/2020 00:23    MDM Wet Prep and GC/CT Labs: UA, UPT, CBC, hCG Ultrasound Antiemetic Assessment and Plan  30 year old [id number] at 6.5weeks Abdominal Pain-None Currently Nausea  -POC Reviewed. -Exam performed. -Patient to self swab for cultures. Instructions Given. -Addressed questions regarding risk for miscarriage.  Informed that risk remains until after 10 weeks of pregnancy. -Bleeding Precautions Reviewed. -Labs ordered.  -Discussed usage of diclegis for nausea at home.  Will give Reglan now. -Will send for [place] and await results.   Cherre Robins 04/05/2020, 11:34 PM   Reassessment (12:35 AM) SIUP at 6.4 weeks  -Provider to bedside to discuss results. -Patient without questions or concerns. -Encouraged to start [facility]. Given list of community providers.  -RX for Diclegis sent to pharmacy on file. -Encouraged to call or return to MAU if symptoms worsen or with the onset of new symptoms. -Discharged to home in stable condition.  Cherre Robins MSN, CNM Advanced Practice Provider, [facility] [employer]

## 2020-04-05 NOTE — MAU Note (Signed)
..  Elaine Meyer is a 30 y.o. at unknown here in MAU reporting: lower abdominal cramping that is mostly on her right side and in the middle. Denies vaginal bleeding. Reports some nausea.  LMP: 02/18/2020 Pain score: 6/10 Vitals:   04/05/20 2224  BP: 118/65  Pulse: 75  Resp: 16  Temp: 98.2 F (36.8 C)  SpO2: 100%      Lab orders placed from triage: UPT, UA

## 2020-04-06 ENCOUNTER — Encounter (HOSPITAL_COMMUNITY): Payer: Self-pay | Admitting: Obstetrics and Gynecology

## 2020-04-06 ENCOUNTER — Inpatient Hospital Stay (HOSPITAL_COMMUNITY): Payer: Self-pay

## 2020-04-06 LAB — HCG, QUANTITATIVE, PREGNANCY: hCG, Beta Chain, Quant, S: 55192 m[IU]/mL — ABNORMAL HIGH (ref <5)

## 2020-04-06 LAB — WET PREP, GENITAL
Clue Cells Wet Prep HPF POC: NONE SEEN
Sperm: NONE SEEN
Trich, Wet Prep: NONE SEEN
Yeast Wet Prep HPF POC: NONE SEEN

## 2020-04-06 MED ORDER — DOXYLAMINE-PYRIDOXINE 10-10 MG PO TBEC: 2.0000 | DELAYED_RELEASE_TABLET | Freq: Every day | ORAL | 2 refills | Status: DC

## 2020-04-06 NOTE — Discharge Instructions (Signed)
  [facility] Providers          [facility]  [address], [place], [place] [address]  [phone]  [facility]  [address], [place], [place] [address]  [phone]  [facility]  [address], [place], [place] [address]  [phone]  [facility] [place]  [address], [place], [place] [address]  [phone]  [facility]  [address]First floor), [place], [place] [address]  [phone]  [facility] [address], [place], [place] [address] [phone]  [facility]  [address], [place], [place] [address]  [phone]  [facility]  [address], [place], [place] [address]  [phone]  [facility]  [address], [place], [place] [address]  [phone]  [facility]  [address], [place], [place] [address]  [phone]  [facility]  [address], [place], [place] [address]  [phone]  [facility]  [address], [place], [place] [address]  [phone]  [facility]   [address], [place], [place] [address]  [phone]  [facility]  [address], [place], [place] [address]   [phone]  [facility]  [address], [place], [place] [address]  [phone]         

## 2020-04-07 LAB — GC/CHLAMYDIA PROBE AMP (~~LOC~~) NOT AT ARMC
Chlamydia: NEGATIVE
Comment: NEGATIVE
Comment: NORMAL
Neisseria Gonorrhea: NEGATIVE

## 2020-06-05 LAB — HEPATITIS C ANTIBODY: HCV Ab: NEGATIVE

## 2020-06-05 LAB — OB RESULTS CONSOLE GC/CHLAMYDIA
Chlamydia: NEGATIVE
Gonorrhea: NEGATIVE

## 2020-06-05 LAB — OB RESULTS CONSOLE RUBELLA ANTIBODY, IGM: Rubella: IMMUNE

## 2020-06-05 LAB — OB RESULTS CONSOLE HEPATITIS B SURFACE ANTIGEN: Hepatitis B Surface Ag: NEGATIVE

## 2020-06-05 LAB — HM PAP SMEAR

## 2020-06-05 LAB — OB RESULTS CONSOLE ABO/RH: RH Type: POSITIVE

## 2020-06-05 LAB — OB RESULTS CONSOLE HIV ANTIBODY (ROUTINE TESTING): HIV: NONREACTIVE

## 2020-06-05 LAB — OB RESULTS CONSOLE RPR: RPR: NONREACTIVE

## 2020-06-05 LAB — OB RESULTS CONSOLE ANTIBODY SCREEN: Antibody Screen: NEGATIVE

## 2020-06-05 LAB — OB RESULTS CONSOLE VARICELLA ZOSTER ANTIBODY, IGG: Varicella: IMMUNE

## 2020-09-01 LAB — OB RESULTS CONSOLE HIV ANTIBODY (ROUTINE TESTING): HIV: NONREACTIVE

## 2020-10-28 LAB — OB RESULTS CONSOLE GC/CHLAMYDIA
Chlamydia: NEGATIVE
Gonorrhea: NEGATIVE

## 2020-10-28 LAB — OB RESULTS CONSOLE GBS: GBS: NEGATIVE

## 2020-11-25 ENCOUNTER — Other Ambulatory Visit: Payer: Self-pay | Admitting: Advanced Practice Midwife

## 2020-11-26 ENCOUNTER — Other Ambulatory Visit: Payer: Self-pay | Admitting: Advanced Practice Midwife

## 2020-11-27 ENCOUNTER — Encounter (HOSPITAL_COMMUNITY): Payer: Self-pay | Admitting: *Deleted

## 2020-11-27 ENCOUNTER — Encounter (HOSPITAL_COMMUNITY): Payer: Self-pay

## 2020-11-27 ENCOUNTER — Telehealth (HOSPITAL_COMMUNITY): Payer: Self-pay | Admitting: *Deleted

## 2020-11-27 NOTE — Telephone Encounter (Signed)
Preadmission screen  

## 2020-11-28 ENCOUNTER — Other Ambulatory Visit: Payer: Self-pay | Admitting: Obstetrics and Gynecology

## 2020-11-28 LAB — SARS CORONAVIRUS 2 (TAT 6-24 HRS): SARS Coronavirus 2: NEGATIVE

## 2020-12-01 ENCOUNTER — Encounter (HOSPITAL_COMMUNITY): Payer: Self-pay | Admitting: Family Medicine

## 2020-12-01 ENCOUNTER — Inpatient Hospital Stay (HOSPITAL_COMMUNITY)
Admission: AD | Admit: 2020-12-01 | Discharge: 2020-12-05 | DRG: 807 | Disposition: A | Discharge status: Home / Self Care | Payer: Self-pay | Attending: Obstetrics and Gynecology | Admitting: Obstetrics and Gynecology

## 2020-12-01 ENCOUNTER — Inpatient Hospital Stay (HOSPITAL_COMMUNITY): Payer: Self-pay

## 2020-12-01 DIAGNOSIS — O480 Post-term pregnancy: Secondary | ICD-10-CM

## 2020-12-01 DIAGNOSIS — Z3A41 41 weeks gestation of pregnancy: Secondary | ICD-10-CM

## 2020-12-01 HISTORY — DX: Post-term pregnancy: O48.0

## 2020-12-01 LAB — CBC
HCT: 34.1 % — ABNORMAL LOW (ref 36.0–46.0)
Hemoglobin: 11.1 g/dL — ABNORMAL LOW (ref 12.0–15.0)
MCH: 30.7 pg (ref 26.0–34.0)
MCHC: 32.6 g/dL (ref 30.0–36.0)
MCV: 94.2 fL (ref 80.0–100.0)
Platelets: 155 10*3/uL (ref 150–400)
RBC: 3.62 MIL/uL — ABNORMAL LOW (ref 3.87–5.11)
RDW: 14.3 % (ref 11.5–15.5)
WBC: 6.5 10*3/uL (ref 4.0–10.5)
nRBC: 0 % (ref 0.0–0.2)

## 2020-12-01 LAB — TYPE AND SCREEN
ABO/RH(D): O POS
Antibody Screen: NEGATIVE

## 2020-12-01 LAB — RPR: RPR Ser Ql: NONREACTIVE

## 2020-12-01 MED ORDER — OXYCODONE-ACETAMINOPHEN 5-325 MG PO TABS: 1.0000 | ORAL_TABLET | ORAL | Status: DC | PRN

## 2020-12-01 MED ORDER — MISOPROSTOL 50MCG HALF TABLET
ORAL_TABLET | ORAL | Status: AC
  Filled 2020-12-01: qty 1

## 2020-12-01 MED ORDER — ACETAMINOPHEN 325 MG PO TABS: 650.0000 mg | ORAL_TABLET | ORAL | Status: DC | PRN

## 2020-12-01 MED ORDER — OXYTOCIN-SODIUM CHLORIDE 30-0.9 UT/500ML-% IV SOLN
2.5000 [IU]/h | INTRAVENOUS | Status: DC
  Administered 2020-12-03: 2.5 [IU]/h via INTRAVENOUS
  Filled 2020-12-01 (×2): qty 500

## 2020-12-01 MED ORDER — LIDOCAINE HCL (PF) 1 % IJ SOLN: 30.0000 mL | INTRAMUSCULAR | Status: DC | PRN

## 2020-12-01 MED ORDER — MISOPROSTOL 25 MCG QUARTER TABLET
25.0000 ug | ORAL_TABLET | ORAL | Status: DC | PRN
Start: 2020-12-01 — End: 2020-12-03
  Administered 2020-12-01: 25 ug via VAGINAL
  Filled 2020-12-01 (×2): qty 1

## 2020-12-01 MED ORDER — TERBUTALINE SULFATE 1 MG/ML IJ SOLN: 0.2500 mg | Freq: Once | INTRAMUSCULAR | Status: DC | PRN

## 2020-12-01 MED ORDER — OXYTOCIN BOLUS FROM INFUSION
333.0000 mL | Freq: Once | INTRAVENOUS | Status: AC
Start: 2020-12-01 — End: 2020-12-03
  Administered 2020-12-03: 333 mL via INTRAVENOUS

## 2020-12-01 MED ORDER — SOD CITRATE-CITRIC ACID 500-334 MG/5ML PO SOLN
30.0000 mL | ORAL | Status: DC | PRN
Start: 2020-12-01 — End: 2020-12-03

## 2020-12-01 MED ORDER — OXYCODONE-ACETAMINOPHEN 5-325 MG PO TABS: 2.0000 | ORAL_TABLET | ORAL | Status: DC | PRN

## 2020-12-01 MED ORDER — OXYTOCIN-SODIUM CHLORIDE 30-0.9 UT/500ML-% IV SOLN
1.0000 m[IU]/min | INTRAVENOUS | Status: DC
  Administered 2020-12-01: 2 m[IU]/min via INTRAVENOUS

## 2020-12-01 MED ORDER — FENTANYL CITRATE (PF) 100 MCG/2ML IJ SOLN
100.0000 ug | INTRAMUSCULAR | Status: DC | PRN
  Administered 2020-12-01: 100 ug via INTRAVENOUS
  Administered 2020-12-01: 50 ug via INTRAVENOUS
  Filled 2020-12-01 (×2): qty 2

## 2020-12-01 MED ORDER — MISOPROSTOL 50MCG HALF TABLET
50.0000 ug | ORAL_TABLET | Freq: Once | ORAL | Status: AC
  Administered 2020-12-01: 50 ug via BUCCAL

## 2020-12-01 MED ORDER — LACTATED RINGERS IV SOLN: INTRAVENOUS | Status: DC

## 2020-12-01 MED ORDER — ONDANSETRON HCL 4 MG/2ML IJ SOLN
4.0000 mg | Freq: Four times a day (QID) | INTRAMUSCULAR | Status: DC | PRN
Start: 2020-12-01 — End: 2020-12-03
  Administered 2020-12-01 – 2020-12-02 (×2): 4 mg via INTRAVENOUS
  Filled 2020-12-01 (×2): qty 2

## 2020-12-01 MED ORDER — LACTATED RINGERS IV SOLN
500.0000 mL | INTRAVENOUS | Status: DC | PRN
  Administered 2020-12-02: 500 mL via INTRAVENOUS

## 2020-12-01 NOTE — Progress Notes (Signed)
Labor Progress Note Ma Denelle Capurro is a 30 y.o. [phone] at [redacted]w[redacted]d presented for IOL for post dates.   S: Patient is resting comfortably. Has started feeling more contractions more strongly in the last hour.  O:  BP 123/67   Pulse 77   Temp 98.2 F (36.8 C) (Oral)   Resp 14   Ht 5\' 4"  (1.626 m)   Wt 100.7 kg   LMP 02/18/2020 (Exact Date)   SpO2 100%   BMI 38.11 kg/m  EFM: 145s/moderate variability/+ accels, no decels Toco: q2-4 minutes, only mildly uncomfortable  CVE: Dilation: 2 Effacement (%): Thick Cervical Position: Posterior Station: -3 Presentation: Vertex Exam by:: Dr. [email or web address]   A&P: 30 y.o. 37 [redacted]w[redacted]d for IOL for post dates. #Labor: SVE 2/50/-3. S/p cytotec x2. Attempted visualization of cervix with speculum/Cook but unsuccessful. Will start pitocin 2x2.  #Pain: epidural when desires, currently doing well, s/p 1 dose IV fentanyl for attempted FB placement #FWB: Cat I, reassuring #GBS negative   [redacted]w[redacted]d, MD Center for [facility], [facility] 6:12 PM

## 2020-12-01 NOTE — Progress Notes (Signed)
LABOR PROGRESS NOTE  [name] is a 30 y.o. [phone] at [redacted]w[redacted]d  presented for IOL for postdates.  Subjective: Comfortable in bed.  Objective: BP 110/60   Pulse 74   Temp 98.2 F (36.8 C) (Oral)   Resp 20   Ht 5\' 4"  (1.626 m)   Wt 100.7 kg   LMP 02/18/2020 (Exact Date)   SpO2 100%   BMI 38.11 kg/m  or  Vitals:   12/01/20 2102 12/01/20 2159 12/01/20 2200 12/01/20 2232  BP: 118/68 119/65  110/60  Pulse: 75 82  74  Resp:      Temp:   98.2 F (36.8 C)   TempSrc:   Oral   SpO2:      Weight:      Height:        Dilation: 3.5 Effacement (%): 50 Cervical Position: Posterior Station: -2 Presentation: Vertex Exam by:: Dr. [email or web address] Fetal monitoring: Baseline: 135 bpm, Variability: Good {> 6 bpm), Accelerations: Reactive, and Decelerations: Absent Uterine activity: Frequency: Every 2.5 minutes  Labs: Lab Results  Component Value Date   WBC 6.5 12/01/2020   HGB 11.1 (L) 12/01/2020   HCT 34.1 (L) 12/01/2020   MCV 94.2 12/01/2020   PLT 155 12/01/2020    Patient Active Problem List   Diagnosis Date Noted   Post term pregnancy over 40 weeks 12/01/2020   Active labor 11/10/2013    Assessment / Plan: 30 y.o. 37 at [redacted]w[redacted]d here for IOL for postdates.  Labor: Progressing well. Continue pitocin. Consider AROM at next check. Fetal Wellbeing:  Category I Pain Control:  Epidural when desires. Anticipated MOD:  Vaginal #GBS negative  [redacted]w[redacted]d, DO 12/01/2020, 10:57 PM PGY-1, [facility]

## 2020-12-01 NOTE — Progress Notes (Signed)
Labor Progress Note Ma Arilla Hice is a 30 y.o. [phone] at [redacted]w[redacted]d presented for IOL for post dates.   S: Patient is resting comfortably. Wanting to try foley bulb again with fentanyl.  O:  BP 130/89   Pulse 81   Temp 98.3 F (36.8 C) (Oral)   Resp 20   Ht 5\' 4"  (1.626 m)   Wt 100.7 kg   LMP 02/18/2020 (Exact Date)   SpO2 100%   BMI 38.11 kg/m  EFM: 140s/moderate variability/+ accels, no decels Toco: occassional irritability, not contracting regularly  CVE: Dilation: 2 Effacement (%): Thick Cervical Position: Posterior Station: -3 Presentation: Vertex Exam by:: Dr. [email or web address]   A&P: 30 y.o. 37 [redacted]w[redacted]d for IOL for post dates. #Labor: Progressing well. SVE 2/50/-3. S/p cytotec x1, will give additional dose. Attempted FB placement with fentanyl and unsuccessful. Plan to recheck in 4 hours, potentially start pitocin at that time. #Pain: epidural when desires, currently doing well, s/p 1 dose IV fentanyl for attempted FB placement #FWB: Cat I #GBS negative    [redacted]w[redacted]d, MD Center for [facility], [facility] 12:59 PM

## 2020-12-01 NOTE — H&P (Addendum)
OBSTETRIC ADMISSION HISTORY AND PHYSICAL  Elaine Meyer Elaine Meyer is a 30 y.o. female [phone] with IUP at [redacted]w[redacted]d by LMP presenting for induction of labor. She reports +FMs, No LOF, no VB, no blurry vision, headaches or peripheral edema, and RUQ pain.  She plans on breast feeding. She is undecided for birth control. She received her prenatal care at [facility]   Dating: By LMP --->  Estimated Date of Delivery: 11/24/20  Sono:    @[redacted]w[redacted]d , CWD, posterior placenta, AFI 16.3   Prenatal History/Complications:  Asymptomatic bacteruria 06/05/20- TOC negative  Past Medical History: Past Medical History:  Diagnosis Date   Abnormal Pap smear    HPV   Vaginal Pap smear, abnormal     Past Surgical History: Past Surgical History:  Procedure Laterality Date   NO PAST SURGERIES      Obstetrical History: OB History     Gravida  4   Para  2   Term  2   Preterm      AB  1   Living  2      SAB  1   IAB      Ectopic      Multiple      Live Births  2           Social History Social History   Socioeconomic History   Marital status: Single    Spouse name: Not on file   Number of children: Not on file   Years of education: Not on file   Highest education level: Not on file  Occupational History   Not on file  Tobacco Use   Smoking status: Never   Smokeless tobacco: Never  Vaping Use   Vaping Use: Never used  Substance and Sexual Activity   Alcohol use: No   Drug use: No   Sexual activity: Yes    Birth control/protection: None  Other Topics Concern   Not on file  Social History Narrative   Not on file   Social Determinants of Health   Financial Resource Strain: Not on file  Food Insecurity: Not on file  Transportation Needs: Not on file  Physical Activity: Not on file  Stress: Not on file  Social Connections: Not on file    Family History: Family History  Problem Relation Age of Onset   Anemia Mother    Asthma Sister    Anemia Sister    Depression  Sister    Asthma Brother    Alcohol abuse Neg Hx    Arthritis Neg Hx    Birth defects Neg Hx    Cancer Neg Hx    COPD Neg Hx    Diabetes Neg Hx    Drug abuse Neg Hx    Early death Neg Hx    Hearing loss Neg Hx    Heart disease Neg Hx    Hyperlipidemia Neg Hx    Hypertension Neg Hx    Kidney disease Neg Hx    Learning disabilities Neg Hx    Mental illness Neg Hx    Mental retardation Neg Hx    Miscarriages / Stillbirths Neg Hx    Stroke Neg Hx    Vision loss Neg Hx    Other Neg Hx     Allergies: No Known Allergies  Pt denies allergies to latex, iodine, or shellfish.  Medications Prior to Admission  Medication Sig Dispense Refill Last Dose   Docosahexaenoic Acid (PRENATAL DHA) 200 MG CAPS Take by mouth.  Doxylamine-Pyridoxine 10-10 MG TBEC Take 2 tablets by mouth at bedtime. 60 tablet 2      Review of Systems   All systems reviewed and negative except as stated in HPI  Blood pressure 122/74, pulse 76, temperature 98 F (36.7 C), temperature source Oral, resp. rate 18, height 5\' 4"  (1.626 m), weight 100.7 kg, last menstrual period 02/18/2020, unknown if currently breastfeeding. General appearance: alert, cooperative, and appears stated age Lungs: normal work of breathing Abdomen: soft, gravid Pelvic: SVE below Extremities: no sign of DVT Presentation: cephalic Fetal monitoringBaseline: 130 bpm, Variability: Good {> 6 bpm), Accelerations: Reactive, and Decelerations: Absent Uterine activity: irregular Dilation: 1.5 Effacement (%): Thick Station: -3 Exam by:: [email or web address] RN   Prenatal labs: ABO, Rh: --/--/PENDING (10/24 0800) Antibody: PENDING (10/24 0800) Rubella: Immune (04/28 0000) RPR: Nonreactive (04/28 0000)  HBsAg: Negative (04/28 0000)  HIV: Non-reactive (07/25 0000)  GBS: Negative/-- (09/20 0000)  3 hr Glucola: fasting 91, 1hr 130, 2hr 111, 3 hr 85 passed Genetic screening:  quad screen negative 06/05/20 Anatomy 06/07/20: posterior placenta, AFI  11  Prenatal Transfer Tool  Maternal Diabetes: No Genetic Screening: Normal Maternal Ultrasounds/Referrals: Normal Fetal Ultrasounds or other Referrals:  None Maternal Substance Abuse:  No Significant Maternal Medications:  None Significant Maternal Lab Results: None  Results for orders placed or performed during the hospital encounter of 12/01/20 (from the past 24 hour(s))  CBC   Collection Time: 12/01/20  7:53 AM  Result Value Ref Range   WBC 6.5 4.0 - 10.5 K/uL   RBC 3.62 (L) 3.87 - 5.11 MIL/uL   Hemoglobin 11.1 (L) 12.0 - 15.0 g/dL   HCT 12/03/20 (L) 09.8 - 11.9 %   MCV 94.2 80.0 - 100.0 fL   MCH 30.7 26.0 - 34.0 pg   MCHC 32.6 30.0 - 36.0 g/dL   RDW 14.7 82.9 - 56.2 %   Platelets 155 150 - 400 K/uL   nRBC 0.0 0.0 - 0.2 %  Type and screen   Collection Time: 12/01/20  8:00 AM  Result Value Ref Range   ABO/RH(D) PENDING    Antibody Screen PENDING    Sample Expiration      12/04/2020,2359 Performed at [facility], [address], [place], [place]     Patient Active Problem List   Diagnosis Date Noted   Post term pregnancy over 40 weeks 12/01/2020   Active labor 11/10/2013    Assessment/Plan:  Elaine Meyer is a 30 y.o. 37 at [redacted]w[redacted]d here for induction of labor for post dates.   #Labor: SVE 1.5/thick/-3. Cytotec 25 mcg vaginally placed. FB attempted but unable to place. Will reassess at next check. #Pain: Epidural when desired #FWB: Cat I #ID:  GBS negative #MOF: breast #MOC: unsure, discussed options #Circ:  Undecided, discussed procedure and that is is elective with patient.  [redacted]w[redacted]d, MD  Center for [facility], [facility] 12/01/2020, 8:51 AM   Attestation of Attending Supervision of Obstetric Fellow: Evaluation and management procedures were performed by the Obstetric Fellow under my supervision and collaboration.  I have reviewed the Obstetric Fellow's note and chart, and I agree with the management  and plan. I have also made any necessary editorial changes.   12/03/2020, MD Family Medicine Attending, [facility] for [facility], [facility] 12/01/2020 10:17 AM

## 2020-12-02 ENCOUNTER — Inpatient Hospital Stay (HOSPITAL_COMMUNITY): Payer: Self-pay | Admitting: Anesthesiology

## 2020-12-02 MED ORDER — EPHEDRINE 5 MG/ML INJ
10.0000 mg | INTRAVENOUS | Status: DC | PRN
Start: 2020-12-02 — End: 2020-12-03

## 2020-12-02 MED ORDER — PHENYLEPHRINE 40 MCG/ML (10ML) SYRINGE FOR IV PUSH (FOR BLOOD PRESSURE SUPPORT): 80.0000 ug | PREFILLED_SYRINGE | INTRAVENOUS | Status: DC | PRN

## 2020-12-02 MED ORDER — EPHEDRINE 5 MG/ML INJ: 10.0000 mg | INTRAVENOUS | Status: DC | PRN

## 2020-12-02 MED ORDER — LIDOCAINE HCL (PF) 1 % IJ SOLN
INTRAMUSCULAR | Status: DC | PRN
  Administered 2020-12-02: 10 mL via EPIDURAL

## 2020-12-02 MED ORDER — FENTANYL-BUPIVACAINE-NACL 0.5-0.125-0.9 MG/250ML-% EP SOLN
12.0000 mL/h | EPIDURAL | Status: DC | PRN
  Administered 2020-12-02 (×2): 12 mL/h via EPIDURAL
  Filled 2020-12-02 (×2): qty 250

## 2020-12-02 MED ORDER — DIPHENHYDRAMINE HCL 50 MG/ML IJ SOLN: 12.5000 mg | INTRAMUSCULAR | Status: DC | PRN

## 2020-12-02 MED ORDER — LACTATED RINGERS IV SOLN
500.0000 mL | Freq: Once | INTRAVENOUS | Status: AC
  Administered 2020-12-02: 500 mL via INTRAVENOUS

## 2020-12-02 MED ORDER — FENTANYL-BUPIVACAINE-NACL 0.5-0.125-0.9 MG/250ML-% EP SOLN: 12.0000 mL/h | EPIDURAL | Status: DC | PRN

## 2020-12-02 NOTE — Anesthesia Procedure Notes (Signed)
Epidural Patient location during procedure: OB Start time: 12/02/2020 2:14 AM End time: 12/02/2020 2:20 AM  Staffing Anesthesiologist: Bethena Midget, MD  Preanesthetic Checklist Completed: patient identified, IV checked, site marked, risks and benefits discussed, surgical consent, monitors and equipment checked, pre-op evaluation and timeout performed  Epidural Patient position: sitting Prep: DuraPrep and site prepped and draped Patient monitoring: continuous pulse ox and blood pressure Approach: midline Location: L3-L4 Injection technique: LOR air  Needle:  Needle type: Tuohy  Needle gauge: 17 G Needle length: 9 cm and 9 Needle insertion depth: 6 cm Catheter type: closed end flexible Catheter size: 19 Gauge Catheter at skin depth: 11 cm Test dose: negative  Assessment Events: blood not aspirated, injection not painful, no injection resistance, no paresthesia and negative IV test

## 2020-12-02 NOTE — Progress Notes (Signed)
LABOR PROGRESS NOTE  Elaine Meyer is a 30 y.o. [phone] at [redacted]w[redacted]d  presented for IOL for postdates.  Subjective: Comfortable in bed on her right side.  Objective: BP (!) 142/91   Pulse 91   Temp 98.4 F (36.9 C) (Oral)   Resp 18   Ht 5\' 4"  (1.626 m)   Wt 100.7 kg   LMP 02/18/2020 (Exact Date)   SpO2 98%   BMI 38.11 kg/m  Vitals:   12/02/20 2130 12/02/20 2230 12/02/20 2300 12/02/20 2310  BP: 100/77 118/69 (!) 142/91   Pulse: 90 83 91   Resp:      Temp:    98.4 F (36.9 C)  TempSrc:    Oral  SpO2:      Weight:      Height:       Dilation: Lip/rim Effacement (%): 100 Cervical Position: Middle Station: 0 Presentation: Vertex Exam by:: [email or web address], RN Fetal monitoring: Baseline: 140 bpm, Variability: Good {> 6 bpm), Accelerations: absent, and Decelerations: Absent Uterine activity: Frequency: Every 2-3 minutes  Labs: Lab Results  Component Value Date   WBC 6.5 12/01/2020   HGB 11.1 (L) 12/01/2020   HCT 34.1 (L) 12/01/2020   MCV 94.2 12/01/2020   PLT 155 12/01/2020    Patient Active Problem List   Diagnosis Date Noted   Post term pregnancy over 40 weeks 12/01/2020   Active labor 11/10/2013    Assessment / Plan: 30 y.o. 37 at [redacted]w[redacted]d here for IOL for postdates.   Labor: Progressing slowly. IUPC placed to further monitor contraction strength. Fetal Wellbeing:  Category I Pain Control:  Epidural Anticipated MOD:  Vaginal #GBS negative  [redacted]w[redacted]d, DO 12/02/2020, 11:32 PM PGY-1, [facility]

## 2020-12-02 NOTE — Anesthesia Preprocedure Evaluation (Signed)
Anesthesia Evaluation  Patient identified by MRN, date of birth, ID band Patient awake    Reviewed: Allergy & Precautions, H&P , NPO status , Patient's Chart, lab work & pertinent test results, reviewed documented beta blocker date and time   Airway Mallampati: I  TM Distance: >3 FB Neck ROM: full    Dental no notable dental hx. (+) Teeth Intact, Dental Advisory Given   Pulmonary neg pulmonary ROS,    Pulmonary exam normal breath sounds clear to auscultation       Cardiovascular negative cardio ROS Normal cardiovascular exam Rhythm:regular Rate:Normal     Neuro/Psych negative neurological ROS  negative psych ROS   GI/Hepatic negative GI ROS, Neg liver ROS,   Endo/Other  negative endocrine ROS  Renal/GU negative Renal ROS  negative genitourinary   Musculoskeletal   Abdominal   Peds  Hematology negative hematology ROS (+)   Anesthesia Other Findings   Reproductive/Obstetrics (+) Pregnancy                             Anesthesia Physical Anesthesia Plan  ASA: 2  Anesthesia Plan: Epidural   Post-op Pain Management:    Induction:   PONV Risk Score and Plan: 3  Airway Management Planned: Natural Airway  Additional Equipment: None  Intra-op Plan:   Post-operative Plan:   Informed Consent: I have reviewed the patients History and Physical, chart, labs and discussed the procedure including the risks, benefits and alternatives for the proposed anesthesia with the patient or authorized representative who has indicated his/her understanding and acceptance.       Plan Discussed with: Anesthesiologist  Anesthesia Plan Comments:         Anesthesia Quick Evaluation

## 2020-12-02 NOTE — Progress Notes (Signed)
Pitocin found to be on 16 mu/min before increase to 18 mu/min. Nurse states she has not increased pitocin on this current shift.

## 2020-12-02 NOTE — Progress Notes (Signed)
LABOR PROGRESS NOTE  Elaine Meyer is a 30 y.o. [phone] at 100w1d  presented for IOL for postdates.  Subjective: Comfortable sleeping in bed. Epidural working well.  Objective: BP (!) 114/58   Pulse 79   Temp 98.2 F (36.8 C) (Oral)   Resp 20   Ht 5\' 4"  (1.626 m)   Wt 100.7 kg   LMP 02/18/2020 (Exact Date)   SpO2 98%   BMI 38.11 kg/m  or  Vitals:   12/02/20 0530 12/02/20 0600 12/02/20 0630 12/02/20 0700  BP: 117/65 112/71 (!) 103/56 (!) 114/58  Pulse: 62 66 (!) 57 79  Resp:      Temp:      TempSrc:      SpO2:      Weight:      Height:        Dilation: 4 Effacement (%): 50 Cervical Position: Posterior Station: -2 Presentation: Vertex Exam by:: [email or web address], RN Fetal monitoring: Baseline: 120 bpm, Variability: Good {> 6 bpm), Accelerations: Reactive, and Decelerations: Absent Uterine activity: Frequency: Every 2-3 minutes  Labs: Lab Results  Component Value Date   WBC 6.5 12/01/2020   HGB 11.1 (L) 12/01/2020   HCT 34.1 (L) 12/01/2020   MCV 94.2 12/01/2020   PLT 155 12/01/2020    Patient Active Problem List   Diagnosis Date Noted   Post term pregnancy over 40 weeks 12/01/2020   Active labor 11/10/2013    Assessment / Plan: 30 y.o. 37 at [redacted]w[redacted]d here for IOL for postdates.  Labor: Progressing well throughout the night with pitocin. Consider AROM with next check.  Fetal Wellbeing:  Category I Pain Control:  Epidural Anticipated MOD:  Vaginal #GBS negative  [redacted]w[redacted]d, DO 12/02/2020, 7:42 AM PGY-1, [facility]

## 2020-12-02 NOTE — Progress Notes (Signed)
Patient ID: Mariabella Nilsen, female   DOB: 11-12-1990, 30 y.o.   MRN: [id number] Labor Progress Note Ma Pansie Guggisberg is a 30 y.o. [id number] at [redacted]w[redacted]d presented for IOL with postdates.  S: Comfortable, resting in bed. Epidural working well.   O:  BP 124/72   Pulse 80   Temp 98 F (36.7 C) (Oral)   Resp 18   Ht 5\' 4"  (1.626 m)   Wt 100.7 kg   LMP 02/18/2020 (Exact Date)   SpO2 98%   BMI 38.11 kg/m  EFM: 145 bpm /moderate variability / accelerations present / variable and early decelerations present with one prolonged deceleration  CVE: Dilation: 7.5 Effacement (%): 90 Cervical Position: Middle Station: -1 Presentation: Vertex Exam by:: [email or web address], RN  AROM: scant, blood tinged fluid returned.  A&P: 30 y.o. 37 [redacted]w[redacted]d for IOL for post dates. #Labor: Progressing well. SVE 7/90/-1. AROM at 1500 with blood-tinged fluid. Continuing with pitocin as tolerated.  #Pain: Well controlled #FWB: Category II fetal heart tracing. Maternal repositioning continues as needed. Pitocin was restarted at 1650. #GBS negative   [redacted]w[redacted]d, MD 7:30 PM

## 2020-12-02 NOTE — Progress Notes (Signed)
Patient ID: Elaine Meyer, female   DOB: [birth year], 30 y.o.   MRN: [id number] Labor Progress Note Elaine Meyer is a 30 y.o. [name] at [redacted]w[redacted]d presented for IOL with postdates.  S: Comfortable, resting in bed. Epidural working well. Interested in AROM.  O:  BP (!) 121/93   Pulse 88   Temp 98.1 F (36.7 C) (Oral)   Resp 18   Ht 5\' 4"  (1.626 m)   Wt 100.7 kg   LMP 02/18/2020 (Exact Date)   SpO2 98%   BMI 38.11 kg/m  EFM: 145 bpm /moderate variability / accelerations present / variable decelerations present with one prolonged deceleration  CVE: Dilation: 7 Effacement (%): 90 Cervical Position: Middle Station: -1 Presentation: Vertex (OP) Exam by:: Dr [email or web address]  AROM: scant, blood tinged fluid returned.  A&P: 30 y.o. 37 [redacted]w[redacted]d for IOL for post dates. #Labor: Progressing well. SVE 7/90/-1. AROM at 1500 with blood-tinged fluid. Continuing to hold off on pitocin for now with category II fetal heart tracing.  #Pain: Well controlled #FWB: Category II fetal heart tracing. Maternal repositioning as needed. Pitocin suspended after prolonged deceleration with good fetal response.  #GBS negative   [redacted]w[redacted]d, MD 3:40 PM

## 2020-12-03 ENCOUNTER — Encounter (HOSPITAL_COMMUNITY): Payer: Self-pay | Admitting: Family Medicine

## 2020-12-03 ENCOUNTER — Other Ambulatory Visit: Payer: Self-pay

## 2020-12-03 DIAGNOSIS — O480 Post-term pregnancy: Secondary | ICD-10-CM

## 2020-12-03 DIAGNOSIS — Z3A41 41 weeks gestation of pregnancy: Secondary | ICD-10-CM

## 2020-12-03 MED ORDER — IBUPROFEN 600 MG PO TABS
600.0000 mg | ORAL_TABLET | Freq: Four times a day (QID) | ORAL | Status: DC | PRN
  Administered 2020-12-03: 600 mg via ORAL
  Filled 2020-12-03: qty 1

## 2020-12-03 MED ORDER — ONDANSETRON HCL 4 MG/2ML IJ SOLN: 4.0000 mg | INTRAMUSCULAR | Status: DC | PRN

## 2020-12-03 MED ORDER — IBUPROFEN 600 MG PO TABS: 600.0000 mg | ORAL_TABLET | Freq: Once | ORAL | Status: DC

## 2020-12-03 MED ORDER — SIMETHICONE 80 MG PO CHEW: 80.0000 mg | CHEWABLE_TABLET | ORAL | Status: DC | PRN

## 2020-12-03 MED ORDER — ONDANSETRON HCL 4 MG PO TABS: 4.0000 mg | ORAL_TABLET | ORAL | Status: DC | PRN

## 2020-12-03 MED ORDER — MEDROXYPROGESTERONE ACETATE 150 MG/ML IM SUSP: 150.0000 mg | INTRAMUSCULAR | Status: DC | PRN

## 2020-12-03 MED ORDER — TETANUS-DIPHTH-ACELL PERTUSSIS 5-2.5-18.5 LF-MCG/0.5 IM SUSY: 0.5000 mL | PREFILLED_SYRINGE | Freq: Once | INTRAMUSCULAR | Status: DC

## 2020-12-03 MED ORDER — DIBUCAINE (PERIANAL) 1 % EX OINT: 1.0000 "application " | TOPICAL_OINTMENT | CUTANEOUS | Status: DC | PRN

## 2020-12-03 MED ORDER — ACETAMINOPHEN 325 MG PO TABS: 650.0000 mg | ORAL_TABLET | ORAL | Status: DC | PRN

## 2020-12-03 MED ORDER — MEASLES, MUMPS & RUBELLA VAC IJ SOLR: 0.5000 mL | Freq: Once | INTRAMUSCULAR | Status: DC

## 2020-12-03 MED ORDER — BENZOCAINE-MENTHOL 20-0.5 % EX AERO: 1.0000 "application " | INHALATION_SPRAY | CUTANEOUS | Status: DC | PRN

## 2020-12-03 MED ORDER — WITCH HAZEL-GLYCERIN EX PADS: 1.0000 "application " | MEDICATED_PAD | CUTANEOUS | Status: DC | PRN

## 2020-12-03 MED ORDER — DIPHENHYDRAMINE HCL 25 MG PO CAPS: 25.0000 mg | ORAL_CAPSULE | Freq: Four times a day (QID) | ORAL | Status: DC | PRN

## 2020-12-03 MED ORDER — COCONUT OIL OIL: 1.0000 "application " | TOPICAL_OIL | Status: DC | PRN

## 2020-12-03 MED ORDER — PRENATAL MULTIVITAMIN CH
1.0000 | ORAL_TABLET | Freq: Every day | ORAL | Status: DC
  Administered 2020-12-03 – 2020-12-04 (×2): 1 via ORAL
  Filled 2020-12-03 (×2): qty 1

## 2020-12-03 MED ORDER — SENNOSIDES-DOCUSATE SODIUM 8.6-50 MG PO TABS
2.0000 | ORAL_TABLET | Freq: Every day | ORAL | Status: DC
  Administered 2020-12-04: 2 via ORAL
  Filled 2020-12-03: qty 2

## 2020-12-03 MED ORDER — IBUPROFEN 600 MG PO TABS
600.0000 mg | ORAL_TABLET | Freq: Four times a day (QID) | ORAL | Status: DC
  Administered 2020-12-03 – 2020-12-05 (×8): 600 mg via ORAL
  Filled 2020-12-03 (×8): qty 1

## 2020-12-03 NOTE — Plan of Care (Signed)

## 2020-12-03 NOTE — Progress Notes (Addendum)
LABOR PROGRESS NOTE  Elaine Meyer is a 30 y.o. [phone] at [redacted]w[redacted]d  presented for IOL for postdates.  Subjective: Feeling pressure and urge to push.  Objective: BP (!) 125/58   Pulse 85   Temp 99 F (37.2 C) (Oral)   Resp 18   Ht 5\' 4"  (1.626 m)   Wt 100.7 kg   LMP 02/18/2020 (Exact Date)   SpO2 99%   BMI 38.11 kg/m  Vitals:   12/03/20 0138 12/03/20 0140 12/03/20 0145 12/03/20 0200  BP:    (!) 125/58  Pulse:    85  Resp:      Temp: 99 F (37.2 C)     TempSrc: Oral     SpO2: 97% 97% 99%   Weight:      Height:        Dilation: 10 Dilation Complete Date: 12/03/20 Dilation Complete Time: 0258 Effacement (%): 100 Cervical Position: Middle Station: 0 Presentation: Vertex Exam by:: Dr. [email or web address] Fetal monitoring: Baseline: 150 bpm, Variability: Good {> 6 bpm), Accelerations: absent, and Decelerations: Absent Uterine activity: Frequency: Every 2.5 minutes  Labs: Lab Results  Component Value Date   WBC 6.5 12/01/2020   HGB 11.1 (L) 12/01/2020   HCT 34.1 (L) 12/01/2020   MCV 94.2 12/01/2020   PLT 155 12/01/2020    Patient Active Problem List   Diagnosis Date Noted   Post term pregnancy over 40 weeks 12/01/2020   Active labor 11/10/2013    Assessment / Plan: 30 y.o. 37 at [redacted]w[redacted]d here for IOL for postdates.  Labor: Attempted practice pushes. Baby did not tolerate pushing on her back well - late decels. Attempted pushing on right side. Minimal progress. Labor down and reassess in 30 min. Fetal Wellbeing:  Category I  Pain Control:  Epidural Anticipated MOD:  Vaginal #GBS negative  [redacted]w[redacted]d, DO 12/03/2020, 3:24 AM PGY-1, [facility]  GME ATTESTATION:  I saw and evaluated the patient. I agree with the findings and the plan of care as documented in the resident's note. I have made changes to documentation as necessary.  Patient now complete and 0 station. Minimal progress with practice pushing and late decels during this  time. FHT now improved after rest and repositioning. Will allow patient to labor down for 3-45 minutes and reassess.   12/05/2020, MD OB Fellow, [facility], Center for [facility] 12/03/2020 3:44 AM

## 2020-12-03 NOTE — Discharge Summary (Signed)
Postpartum Discharge Summary     Patient Name: Elaine Meyer DOB: [birth year] MRN: [id number]  Date of admission: 12/01/2020 Delivery date:12/03/2020  Delivering provider: Genia Del  Date of discharge: 12/05/2020  Admitting diagnosis: Post term pregnancy over 40 weeks [O48.0] Intrauterine pregnancy: [redacted]w[redacted]d    Secondary diagnosis:  Principal Problem:   Vaginal delivery Active Problems:   Post term pregnancy over 40 weeks   Shoulder dystocia, delivered  Additional problems: None    Discharge diagnosis: Term Pregnancy Delivered                                              Post partum procedures: None Augmentation: AROM, Pitocin, and Cytotec Complications: Shoulder dystocia  Hospital course: Induction of Labor With Vaginal Delivery   30y.o. yo [id number] admitted to the hospital 12/01/2020 for induction of labor.  Indication for induction: Postdates.  Patient had an uncomplicated labor course and progressed to completed after receiving Cytotec, Pitocin, and AROM. She had a vaginal delivery that was complicated by a shoulder dystocia.  Membrane Rupture Time/Date: 3:30 PM ,12/02/2020   Delivery Method:Vaginal, Spontaneous  Episiotomy: None  Lacerations:  None  Details of delivery can be found in separate delivery note.  Patient had a routine postpartum course and is meeting all milestones. Patient is discharged home 12/05/20.  Newborn Data: Birth date:12/03/2020  Birth time:6:53 AM  Gender:Female  Living status:Living  Apgars:5 ,6 , and 9 Weight:4310 g   Magnesium Sulfate received: No BMZ received: No Rhophylac: N/A MMR: N/A - Immune  T-DaP: Given prenatally Flu: No Transfusion: No  Physical exam  Vitals:   12/04/20 0620 12/04/20 1250 12/04/20 2030 12/05/20 0547  BP: 129/73 123/75 119/77 121/72  Pulse: 69 78 74 (!) 57  Resp: '16  16 16  ' Temp: 98 F (36.7 C) 98.1 F (36.7 C) 98 F (36.7 C) 98 F (36.7 C)  TempSrc: Oral Oral Oral  Oral  SpO2: 100% 99% 100% 100%  Weight:      Height:       General: alert, cooperative, and no distress Lochia: appropriate Uterine Fundus: firm DVT Evaluation: 1+ pitting edema to bilateral lower extremities up to mid-shin, no calf tenderness to palpation   Labs: Lab Results  Component Value Date   WBC 6.5 12/01/2020   HGB 11.1 (L) 12/01/2020   HCT 34.1 (L) 12/01/2020   MCV 94.2 12/01/2020   PLT 155 12/01/2020   No flowsheet data found. Edinburgh Score: Edinburgh Postnatal Depression Scale Screening Tool 12/03/2020  I have been able to laugh and see the funny side of things. 0  I have looked forward with enjoyment to things. 0  I have blamed myself unnecessarily when things went wrong. 0  I have been anxious or worried for no good reason. 0  I have felt scared or panicky for no good reason. 0  Things have been getting on top of me. 2  I have been so unhappy that I have had difficulty sleeping. 0  I have felt sad or miserable. 0  I have been so unhappy that I have been crying. 0  The thought of harming myself has occurred to me. 0  Edinburgh Postnatal Depression Scale Total 2     After visit meds:  Allergies as of 12/05/2020   No Known Allergies  Medication List     STOP taking these medications    Doxylamine-Pyridoxine 10-10 MG Tbec   PreNatal DHA 200 MG Caps Generic drug: Docosahexaenoic Acid       TAKE these medications    acetaminophen 500 MG tablet Commonly known as: TYLENOL Take 2 tablets (1,000 mg total) by mouth every 8 (eight) hours as needed (pain).   ibuprofen 600 MG tablet Commonly known as: ADVIL Take 1 tablet (600 mg total) by mouth every 6 (six) hours as needed (pain).         Discharge home in stable condition Infant Feeding: Breast Infant Disposition: home with mother Discharge instruction: per After Visit Summary and Postpartum booklet. Activity: Advance as tolerated. Pelvic rest for 6 weeks.  Diet: routine  diet  Follow up Visit: Patient to [facility] for her postpartum care in 4-6 weeks.   Additional Postpartum F/U: None   Low risk pregnancy complicated by:  None Delivery mode:  Vaginal, Spontaneous  Anticipated Birth Control:  Unsure and plans to discuss at her postpartum visit  12/05/2020 Genia Del, MD

## 2020-12-03 NOTE — Anesthesia Postprocedure Evaluation (Signed)
Anesthesia Post Note  Patient: Elaine Meyer  Procedure(s) Performed: AN AD HOC LABOR EPIDURAL     Patient location during evaluation: Mother Baby Anesthesia Type: Epidural Level of consciousness: awake, oriented and awake and alert Pain management: pain level controlled Vital Signs Assessment: post-procedure vital signs reviewed and stable Respiratory status: spontaneous breathing, respiratory function stable and nonlabored ventilation Cardiovascular status: stable Postop Assessment: no headache, adequate PO intake, patient able to bend at knees, able to ambulate and no apparent nausea or vomiting Anesthetic complications: no   No notable events documented.  Last Vitals:  Vitals:   12/03/20 1030 12/03/20 1432  BP: 133/70 112/70  Pulse: 70 60  Resp: 18 17  Temp: 36.7 C 36.7 C  SpO2:      Last Pain:  Vitals:   12/03/20 1630  TempSrc:   PainSc: 0-No pain   Pain Goal:                   Holly Iannaccone

## 2020-12-03 NOTE — Progress Notes (Signed)
   12/03/20 0700  Clinical Encounter Type  Visited With Patient not available  Visit Type Code  Referral From Nurse  Consult/Referral To Chaplain   Chaplain responded to code. Unit secretary contacted the attending nurse who stated Chaplain service is not needed at this time. Chaplain remains available for follow-up spiritual/emotional support as needed. This note was prepared by Deneen Harts, M.Div..  For questions please contact by phone [phone].

## 2020-12-03 NOTE — Lactation Note (Signed)
This note was copied from a baby's chart.  NICU Lactation Consultation Note  Patient Name: Elaine Meyer Today's Date: 12/03/2020 Age:29 hours   Subjective Reason for consult: Initial assessment; NICU baby Pump is set up in room and mother has been provided with education. She has not initiated pumping yet but plans to begin this afternoon. She bf her other children and is aware of the importance of breast stimulation. Mother does not have a pump to use at home and requests a [facility] referral.   Objective Infant data: Mother's Current Feeding Choice: Breast Milk  Infant feeding assessment Scale for Readiness: 1   Maternal data: [id number]  Vaginal, Spontaneous Significant Breast History:: no  Does the patient have breastfeeding experience prior to this delivery?: Yes How long did the patient breastfeed?: 12+ months with first and 6 months with second   WIC Program: Yes WIC Referral Sent?: Yes   Assessment Infant:  Maternal: This is an experienced bf'ing mother who has no identifiable risk factors at this time.   Intervention/Plan Interventions: Education NICU book and LC brochure were left in infant's NICU room. Tools: Pump  Plan: Consult Status: Follow-up  NICU Follow-up type: New admission follow up Mother to begin pumping and repeat q3 for 15 minutes LC will need to f/u to verify pump for use at home prn.   Elder Negus 12/03/2020, 4:03 PM

## 2020-12-04 NOTE — Progress Notes (Signed)
Patient screened out for psychosocial assessment since none of the following apply: °Psychosocial stressors documented in mother or baby's chart °Gestation less than 32 weeks °Code at delivery  °Infant with anomalies °Please contact the Clinical Social Worker if specific needs arise, by MOB's request, or if MOB scores greater than 9/yes to question 10 on Edinburgh Postpartum Depression Screen. ° °Elaine Meyer, MSW, LCSW °Clinical Social Work °[phone] °  °

## 2020-12-04 NOTE — Progress Notes (Signed)
Post Partum Day 1  Subjective: Doing well. No acute events overnight. Pain is controlled and bleeding is appropriate. She is eating, drinking, voiding, and ambulating without issue. She has no other concerns at this time.  Objective: Blood pressure 129/73, pulse 69, temperature 98 F (36.7 C), temperature source Oral, resp. rate 16, height 5\' 4"  (1.626 m), weight 100.7 kg, last menstrual period 02/18/2020, SpO2 100 %, unknown if currently breastfeeding.  Physical Exam:  General: alert, cooperative, and no distress Lochia: appropriate Uterine Fundus: firm DVT Evaluation: trace LE edema bilaterally  Recent Labs    12/01/20 0753  HGB 11.1*  HCT 34.1*    Assessment/Plan: Elaine Meyer is a 30 y.o. [phone] on PPD# 1 s/p SVD.  Progressing well. Meeting postpartum milestones. VSS. Continue routine postpartum care.  Feeding: Breast; using donor milk right now while infant is in NICU Contraception: Unsure; planning to decide at postpartum visit at the HD Circumcision: No  Dispo: Plan for discharge on PPD#2.    LOS: 3 days   [id number], MD  12/04/2020, 7:16 AM

## 2020-12-05 LAB — SURGICAL PATHOLOGY

## 2020-12-05 MED ORDER — ACETAMINOPHEN 500 MG PO TABS: 1000.0000 mg | ORAL_TABLET | Freq: Three times a day (TID) | ORAL | 0 refills | Status: DC | PRN

## 2020-12-05 MED ORDER — IBUPROFEN 600 MG PO TABS: 600.0000 mg | ORAL_TABLET | Freq: Four times a day (QID) | ORAL | 0 refills | Status: DC | PRN

## 2020-12-05 NOTE — Lactation Note (Signed)
This note was copied from a baby's chart. Lactation Consultation Note  Patient Name: Elaine Meyer [id number] Date: 12/05/2020 Reason for consult: Follow-up assessment;Term;Infant weight loss;Other (Comment) (3% weight loss / for D/C today / mom working on transitioning baby to the breast / and pumping. S/P baby being in NICU. baby asleep.) Age:29 hours LC explored options with mom to transition baby back to the breast.  Option #1 - prior to latching - warm moist heat with towel/ breast massage, hand express, pre pump with hand pump , give appetizer 10 ml and attempt to latch.  And supplement afterwards 44ml  Post pump both breast 10 - 15 mins / save milk for next feeding.  Option #2 - if baby doesn't latch , keep the pumping of both breast 10 -15 mins  Consistent around feedings / save milk .  LC O/P [facility] - ( request placed and mom aware she will receive a call )  For appt next week.  LC stressed the importance of protecting the milk supply.    Maternal Data    Feeding Mother's Current Feeding Choice: Breast Milk  LATCH Score    Lactation Tools Discussed/Used Breast pump type: Double-Electric Breast Pump;Manual Pump Education: Milk Storage Pumping frequency: LC reviewed supply and demand / importance of consistent pumping around the clock until the baby is more consistent with latching.  Interventions    Discharge Discharge Education: Engorgement and breast care;Warning signs for feeding baby;Outpatient recommendation;Outpatient Epic message sent;Other (comment) (mom aware she will receive a phone call from the clinic for F/U LC next week.) Pump: Manual;DEBP (will be getting a WIC DEBP today at 3pm) WIC Program: Yes (per mom has appt with WIC at 3pm today)  Consult Status Consult Status: Complete Date: 12/05/20    Kathrin Greathouse 12/05/2020, 12:24 PM

## 2020-12-09 ENCOUNTER — Telehealth: Payer: Self-pay | Admitting: Physician Assistant

## 2020-12-09 DIAGNOSIS — G44209 Tension-type headache, unspecified, not intractable: Secondary | ICD-10-CM

## 2020-12-09 DIAGNOSIS — M62838 Other muscle spasm: Secondary | ICD-10-CM

## 2020-12-09 MED ORDER — CYCLOBENZAPRINE HCL 5 MG PO TABS: 5.0000 mg | ORAL_TABLET | Freq: Every evening | ORAL | 0 refills | Status: DC | PRN

## 2020-12-09 NOTE — Progress Notes (Signed)
Virtual Visit Consent   Elaine Meyer, you are scheduled for a virtual visit with a [facility] provider today.     Just as with appointments in the office, your consent must be obtained to participate.  Your consent will be active for this visit and any virtual visit you may have with one of our providers in the next 365 days.     If you have a MyChart account, a copy of this consent can be sent to you electronically.  All virtual visits are billed to your insurance company just like a traditional visit in the office.    As this is a virtual visit, video technology does not allow for your provider to perform a traditional examination.  This may limit your provider's ability to fully assess your condition.  If your provider identifies any concerns that need to be evaluated in person or the need to arrange testing (such as labs, EKG, etc.), we will make arrangements to do so.     Although advances in technology are sophisticated, we cannot ensure that it will always work on either your end or our end.  If the connection with a video visit is poor, the visit may have to be switched to a telephone visit.  With either a video or telephone visit, we are not always able to ensure that we have a secure connection.     I need to obtain your verbal consent now.   Are you willing to proceed with your visit today?    Elaine Meyer has provided verbal consent on 12/09/2020 for a virtual visit (video or telephone).   Piedad Climes, [place]   Date: 12/09/2020 12:23 PM   Virtual Visit via Video Note   I, Piedad Climes, connected with  Elaine Meyer  ([id number], [birth year]) on 12/09/20 at 12:00 PM EDT by a video-enabled telemedicine application and verified that I am speaking with the correct person using two identifiers.  Location: Patient: Virtual Visit Location Patient: Mobile -- parked car Provider: Virtual Visit Location Provider: Home Office   I discussed  the limitations of evaluation and management by telemedicine and the availability of in person appointments. The patient expressed understanding and agreed to proceed.    History of Present Illness: Elaine Meyer is a 30 y.o. who identifies as a female who was assigned female at birth, and is being seen today for muscle tension in neck and shoulders with posterior headache starting yesterday on waking up in the morning. Thought she pulled a muscle or slept wrong but symptoms have continued since then despite OTC medication. Denies decreased ROM or neck or shoulders but some movements with increased tension and discomfort. Is currently breastfeeding a newborn.Marland Kitchen   HPI: HPI   Problems:  Patient Active Problem List   Diagnosis Date Noted   Vaginal delivery 12/03/2020   Shoulder dystocia, delivered 12/03/2020   Post term pregnancy over 40 weeks 12/01/2020   Active labor 11/10/2013    Allergies: No Known Allergies Medications:  Current Outpatient Medications:    cyclobenzaprine (FLEXERIL) 5 MG tablet, Take 1 tablet (5 mg total) by mouth at bedtime as needed for muscle spasms., Disp: 10 tablet, Rfl: 0   acetaminophen (TYLENOL) 500 MG tablet, Take 2 tablets (1,000 mg total) by mouth every 8 (eight) hours as needed (pain)., Disp: 60 tablet, Rfl: 0   ibuprofen (ADVIL) 600 MG tablet, Take 1 tablet (600 mg total) by mouth every 6 (six) hours  as needed (pain)., Disp: 40 tablet, Rfl: 0  Observations/Objective: Patient is well-developed, well-nourished in no acute distress.  Resting comfortably in parked car.  Head is normocephalic, atraumatic.  No labored breathing. Speech is clear and coherent with logical content.  Patient is alert and oriented at baseline.   Assessment and Plan: 1. Muscle spasms of neck - cyclobenzaprine (FLEXERIL) 5 MG tablet; Take 1 tablet (5 mg total) by mouth at bedtime as needed for muscle spasms.  Dispense: 10 tablet; Refill: 0  2. Tension headache -  cyclobenzaprine (FLEXERIL) 5 MG tablet; Take 1 tablet (5 mg total) by mouth at bedtime as needed for muscle spasms.  Dispense: 10 tablet; Refill: 0   Likely combination of sleep position and carrying/breastfeeding a new born causing added tension in these muscles. Supportive measures and OTC medications reviewed with patient. Recommend topical NSAID and heating pad use. Will give low-dose Flexeril to use once daily PRN for muscle tension since she has a partner who is also off and taking care of baby. Formula feed during use of this medication if possible. If needing to breastfeed while on medication she is to monitor baby for any drowsiness. She is not to drive, operate machinery or handle child alone if taking this medication. She is to reserve use for if symptoms are not responding to the other aforementioned measures. She voiced understanding and agreement.   Follow Up Instructions: I discussed the assessment and treatment plan with the patient. The patient was provided an opportunity to ask questions and all were answered. The patient agreed with the plan and demonstrated an understanding of the instructions.  A copy of instructions were sent to the patient via MyChart unless otherwise noted below.   The patient was advised to call back or seek an in-person evaluation if the symptoms worsen or if the condition fails to improve as anticipated.  Time:  I spent 12 minutes with the patient via telehealth technology discussing the above problems/concerns.    Piedad Climes, PA-C

## 2020-12-09 NOTE — Patient Instructions (Signed)
  Elaine Meyer, thank you for joining Piedad Climes, PA-C for today's virtual visit.  While this provider is not your primary care provider (PCP), if your PCP is located in our provider database this encounter information will be shared with them immediately following your visit.  Consent: (Patient) Elaine Meyer provided verbal consent for this virtual visit at the beginning of the encounter.  Current Medications:  Current Outpatient Medications:    acetaminophen (TYLENOL) 500 MG tablet, Take 2 tablets (1,000 mg total) by mouth every 8 (eight) hours as needed (pain)., Disp: 60 tablet, Rfl: 0   ibuprofen (ADVIL) 600 MG tablet, Take 1 tablet (600 mg total) by mouth every 6 (six) hours as needed (pain)., Disp: 40 tablet, Rfl: 0   Medications ordered in this encounter:  No orders of the defined types were placed in this encounter.    *If you need refills on other medications prior to your next appointment, please contact your pharmacy*  Follow-Up: Call back or seek an in-person evaluation if the symptoms worsen or if the condition fails to improve as anticipated.  Other Instructions Please avoid heavy lifting, pushing and pulling.  You can continue OTC medication approved by your OB. Start a heating pad to the neck and shoulders for 10-15 minutes a few times per day.  Can use topical Voltaren or Icy Hot but do not apply and then use the heating pad. The flexeril is to be used no more than as directed if symptoms are not responding to the measures above. You are only to take if remaining at home and someone else is available to look after your baby.  Try to formula feed if possible while on this medication.  You are not to drive or operate machinery while on this medication. This is why it is dosed at nighttime. If there are any worsening symptoms, you need an in-person evaluation ASAP.   If you have been instructed to have an in-person evaluation today at a  local Urgent Care facility, please use the link below. It will take you to a list of all of our available [facility], including address, phone number and hours of operation. Please do not delay care.  [facility]  If you or a family member do not have a primary care provider, use the link below to schedule a visit and establish care. When you choose a [facility] primary care physician or advanced practice provider, you gain a long-term partner in health. Find a Primary Care Provider  Learn more about [facility]'s in-office and virtual care options: [facility] - Get Care Now

## 2020-12-16 ENCOUNTER — Telehealth (HOSPITAL_COMMUNITY): Payer: Self-pay

## 2020-12-16 NOTE — Telephone Encounter (Signed)
"  I'm doing good." Patient has no questions or concerns about her healing.  "His umbilical cord fell off on day 5. I spoke to the on call nurse at the pediatrician office but it was at night. It has dried up now. Do I need to do anything else for it?" RN reviewed cord care with patient. RN also reviewed signs of infection with the cord as well. "He is gaining weight. We have another weight check on Friday this week. He sleeps in a bassinet." RN reviewed ABC's of safe sleep with patient. Patient declines any questions or concerns about baby.  EPDS score is 4.  Elaine Meyer [facility] 12/16/2020,1331

## 2022-01-22 IMAGING — US US OB COMP LESS 14 WK
1 series · 15 of 28 positions shown · non-contrast
Comparison: None.

CLINICAL DATA: Vaginal bleeding.

EXAM:
OBSTETRIC <14 WK ULTRASOUND
TECHNIQUE: Transabdominal ultrasound was performed for evaluation of the
gestation as well as the maternal uterus and adnexal regions.

[Series 1: us ob comp less 14 wk · 15 of 31 slices shown]
[im 1/31]
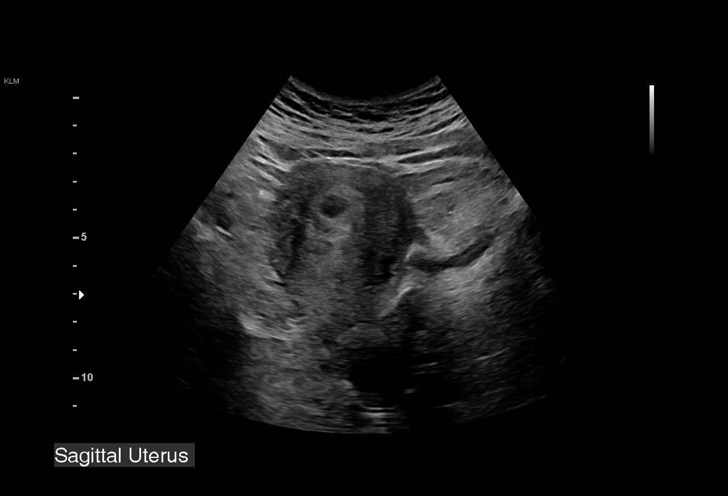
[im 3/31]
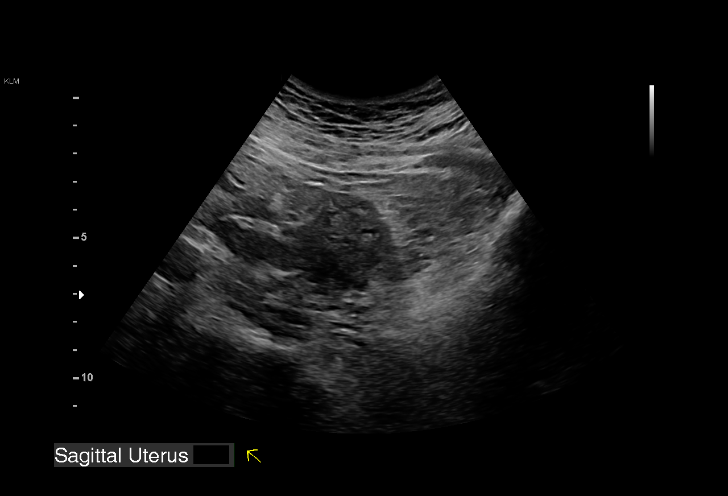
[im 5/31]
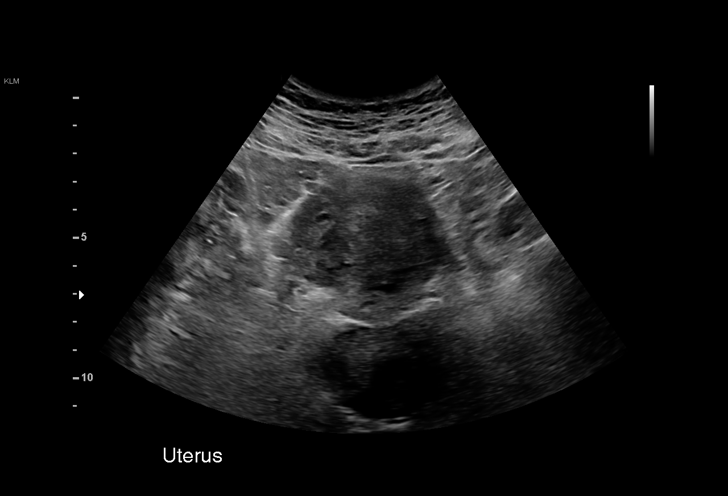
[im 7/31]
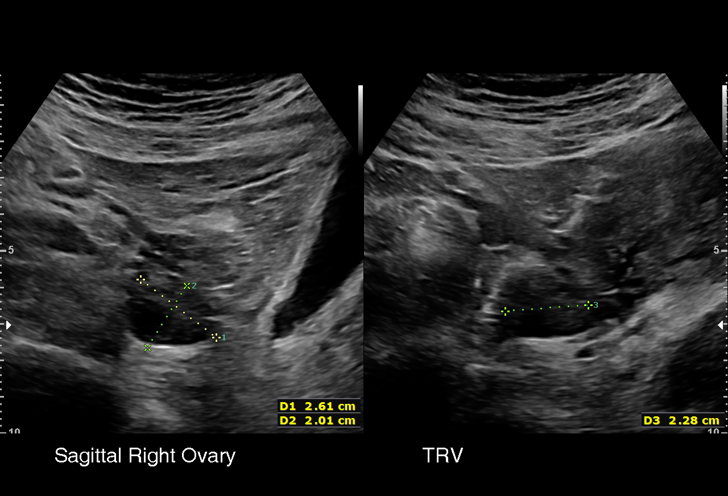
[im 9/31]
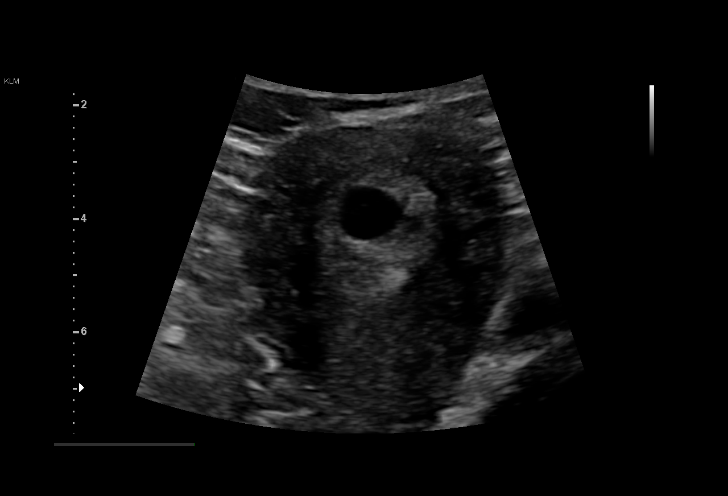
[im 12/31]
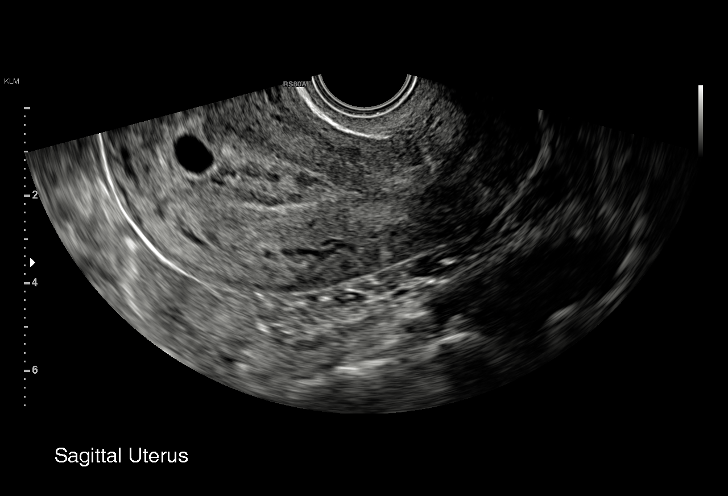
[im 14/31]
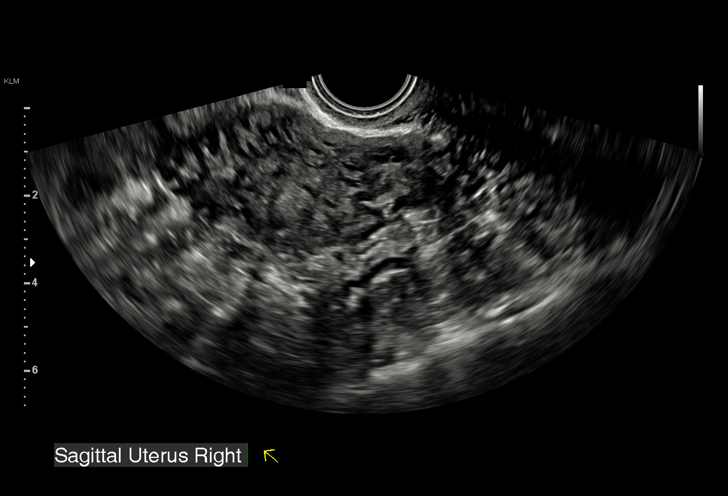
[im 16/31]
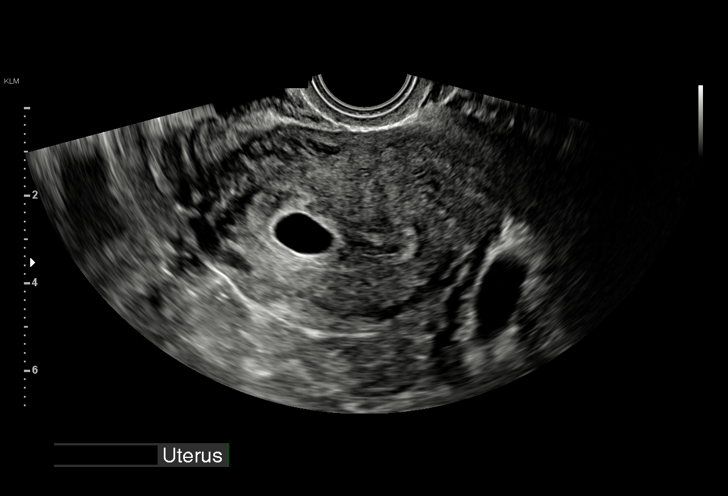
[im 17/31]
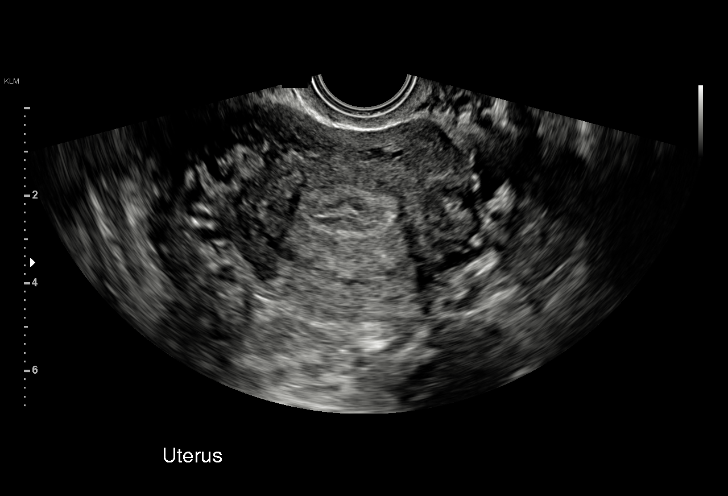
[im 19/31]
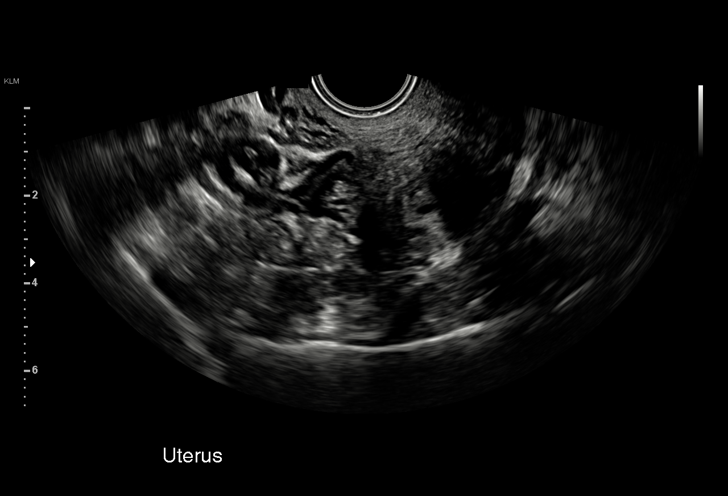
[im 22/31]
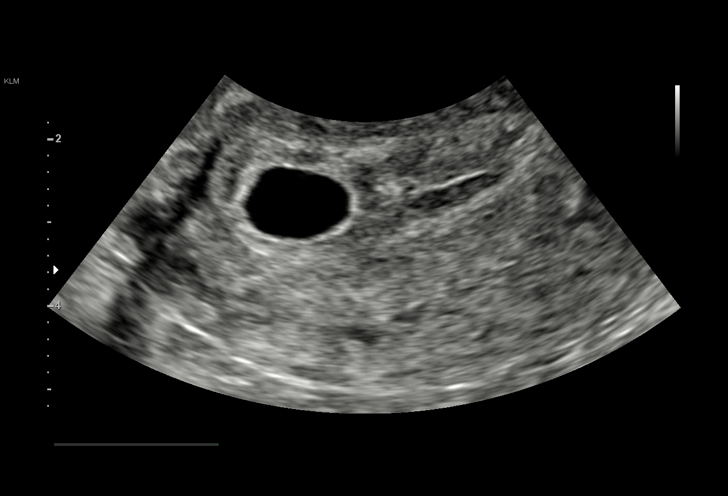
[im 24/31]
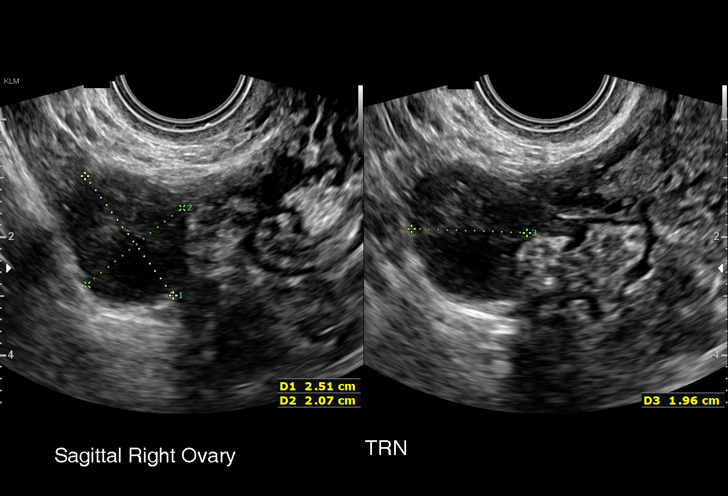
[im 26/31]
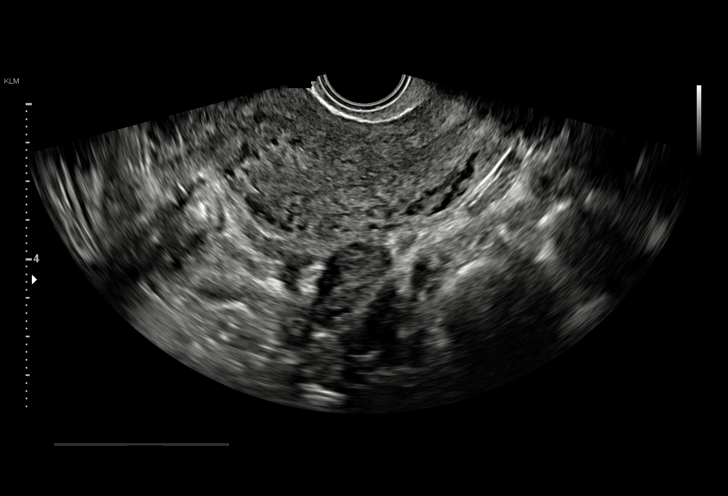
[im 28/31]
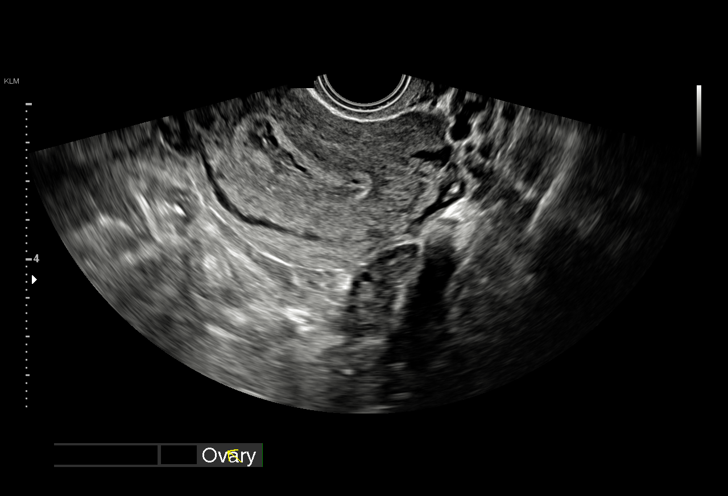
[im 31/31]
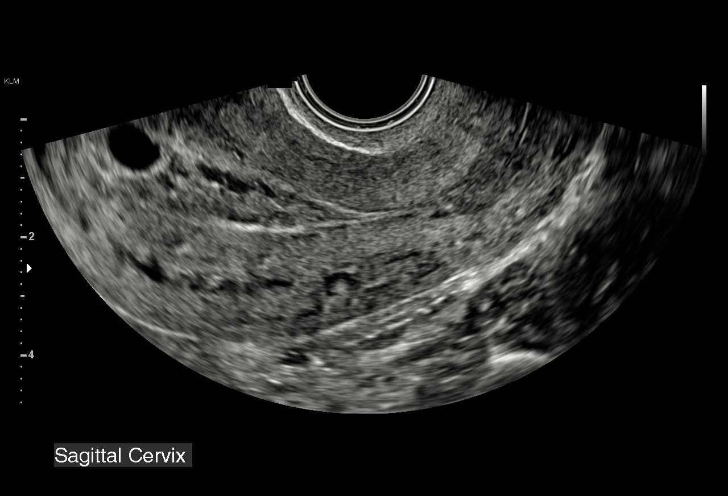

[15 of 28 positions shown; findings below may reference images not displayed]

FINDINGS: Intrauterine gestational sac: Single

Yolk sac:  Not Visualized.

Embryo:  Not Visualized.

Cardiac Activity: Not Visualized.

Heart Rate: N/A bpm

MSD:  11.77 mm   5 w   6 d

Subchorionic hemorrhage:  None visualized.

Maternal uterus/adnexae: The bilateral ovaries are visualized and
normal in appearance.

No pelvic free fluid is seen.
IMPRESSION: Single intrauterine gestational sac, at approximately 5 weeks and 6
days gestation by ultrasound evaluation, without visualization of a
fetal pole. While this may be secondary to early intrauterine
pregnancy, correlation with follow-up pelvic ultrasound is
recommended.

## 2022-02-08 NOTE — L&D Delivery Note (Cosign Needed Addendum)
OB/GYN Faculty Practice Delivery Note  Ma Jillian Blain is a 32 y.o. [id number] s/p SVD at [redacted]w[redacted]d. She was admitted for IOL for GDMA2.   ROM: 5h 31m with clear fluid GBS Status:  Negative/-- (08/30 1648) Maximum Maternal Temperature: 98.48F  Labor Progress: Initial SVE: 1.5/thick/-2. Augmentation with AROM. She then progressed to complete.   Delivery Date/Time: 10/22/2022 @1128  Delivery: Called to room and patient was complete and pushing. Head delivered LOA. No nuchal cord present. Shoulder and body delivered in usual fashion. Infant with spontaneous cry, placed on mother's abdomen, dried and stimulated. Cord clamped x 2 after 1-minute delay, and cut by FOB. Cord blood drawn. Placenta delivered spontaneously with gentle cord traction. Fundus firm with massage and Pitocin. Labia, perineum, vagina, and cervix inspected without lesion.   Baby Weight: 3620g  Placenta: 3 vessel, intact. Sent to L&D Complications: None Lacerations: None EBL: 225 mL Analgesia: Epidural   Infant:  APGAR (1 MIN): 9  APGAR (5 MINS):  9  Wyn Forster, MD OB Family Medicine Fellow, [facility], [facility] 10/22/2022, 11:43 AM

## 2022-02-15 ENCOUNTER — Telehealth: Payer: Self-pay | Admitting: Physician Assistant

## 2022-02-15 DIAGNOSIS — N61 Mastitis without abscess: Secondary | ICD-10-CM

## 2022-02-15 DIAGNOSIS — O9279 Other disorders of lactation: Secondary | ICD-10-CM

## 2022-02-15 MED ORDER — AMOXICILLIN-POT CLAVULANATE 875-125 MG PO TABS: 1.0000 | ORAL_TABLET | Freq: Two times a day (BID) | ORAL | 0 refills | Status: DC

## 2022-02-15 NOTE — Progress Notes (Signed)
Virtual Visit Consent   Elaine Meyer, you are scheduled for a virtual visit with a [facility] provider today. Just as with appointments in the office, your consent must be obtained to participate. Your consent will be active for this visit and any virtual visit you may have with one of our providers in the next 365 days. If you have a MyChart account, a copy of this consent can be sent to you electronically.  As this is a virtual visit, video technology does not allow for your provider to perform a traditional examination. This may limit your provider's ability to fully assess your condition. If your provider identifies any concerns that need to be evaluated in person or the need to arrange testing (such as labs, EKG, etc.), we will make arrangements to do so. Although advances in technology are sophisticated, we cannot ensure that it will always work on either your end or our end. If the connection with a video visit is poor, the visit may have to be switched to a telephone visit. With either a video or telephone visit, we are not always able to ensure that we have a secure connection.  By engaging in this virtual visit, you consent to the provision of healthcare and authorize for your insurance to be billed (if applicable) for the services provided during this visit. Depending on your insurance coverage, you may receive a charge related to this service.  I need to obtain your verbal consent now. Are you willing to proceed with your visit today? Elaine Meyer has provided verbal consent on 02/15/2022 for a virtual visit (video or telephone). [name], PA-C  Date: 02/15/2022 8:10 AM  Virtual Visit via Video Note   I, [name], connected with  Elaine Meyer  ([id number], January 21, 1991) on 02/15/22 at  8:00 AM EST by a video-enabled telemedicine application and verified that I am speaking with the correct person using two  identifiers.  Location: Patient: Virtual Visit Location Patient: Home Provider: Virtual Visit Location Provider: Home Office   I discussed the limitations of evaluation and management by telemedicine and the availability of in person appointments. The patient expressed understanding and agreed to proceed.    History of Present Illness: Elaine Meyer is a 32 y.o. who identifies as a female who was assigned female at birth, and is being seen today for red, tender, warm, swollen right breast. She is breastfeeding. Symptoms started about 3 days ago with soreness in right breast. Having some mild fevers (subjective), chills, body aches. Having decreased milk production.    Problems:  Patient Active Problem List   Diagnosis Date Noted   Vaginal delivery 12/03/2020   Shoulder dystocia, delivered 12/03/2020   Post term pregnancy over 40 weeks 12/01/2020   Active labor 11/10/2013    Allergies: No Known Allergies Medications:  Current Outpatient Medications:    amoxicillin-clavulanate (AUGMENTIN) 875-125 MG tablet, Take 1 tablet by mouth 2 (two) times daily., Disp: 20 tablet, Rfl: 0   acetaminophen (TYLENOL) 500 MG tablet, Take 2 tablets (1,000 mg total) by mouth every 8 (eight) hours as needed (pain)., Disp: 60 tablet, Rfl: 0   cyclobenzaprine (FLEXERIL) 5 MG tablet, Take 1 tablet (5 mg total) by mouth at bedtime as needed for muscle spasms., Disp: 10 tablet, Rfl: 0   ibuprofen (ADVIL) 600 MG tablet, Take 1 tablet (600 mg total) by mouth every 6 (six) hours as needed (pain)., Disp: 40 tablet, Rfl: 0  Observations/Objective:  Patient is well-developed, well-nourished in no acute distress.  Resting comfortably at home.  Head is normocephalic, atraumatic.  No labored breathing.  Speech is clear and coherent with logical content.  Patient is alert and oriented at baseline.    Assessment and Plan: 1. Mastitis - amoxicillin-clavulanate (AUGMENTIN) 875-125 MG tablet; Take 1 tablet by  mouth 2 (two) times daily.  Dispense: 20 tablet; Refill: 0  2. Clogged duct, postpartum - amoxicillin-clavulanate (AUGMENTIN) 875-125 MG tablet; Take 1 tablet by mouth 2 (two) times daily.  Dispense: 20 tablet; Refill: 0  - Augmentin prescribed - Warm compresses - Massage - Continue to pump from right breast until feels comfortable to breastfeed, can breastfeed now if desired, but pain is limiting - Tylenol as needed - Seek in person evaluation if symptoms worsen or fail to improve  Follow Up Instructions: I discussed the assessment and treatment plan with the patient. The patient was provided an opportunity to ask questions and all were answered. The patient agreed with the plan and demonstrated an understanding of the instructions.  A copy of instructions were sent to the patient via MyChart unless otherwise noted below.    The patient was advised to call back or seek an in-person evaluation if the symptoms worsen or if the condition fails to improve as anticipated.  Time:  I spent 12 minutes with the patient via telehealth technology discussing the above problems/concerns.    Mar Daring, PA-C

## 2022-02-15 NOTE — Patient Instructions (Signed)
Elaine Meyer, thank you for joining Elaine Loveless, PA-C for today's virtual visit.  While this provider is not your primary care provider (PCP), if your PCP is located in our provider database this encounter information will be shared with them immediately following your visit.   A [facility] MyChart account gives you access to today's visit and all your visits, tests, and labs performed at [facility] " click here if you don't have a [facility] MyChart account or go to [email or web address]  Consent: (Patient) Elaine Meyer provided verbal consent for this virtual visit at the beginning of the encounter.  Current Medications:  Current Outpatient Medications:    amoxicillin-clavulanate (AUGMENTIN) 875-125 MG tablet, Take 1 tablet by mouth 2 (two) times daily., Disp: 20 tablet, Rfl: 0   acetaminophen (TYLENOL) 500 MG tablet, Take 2 tablets (1,000 mg total) by mouth every 8 (eight) hours as needed (pain)., Disp: 60 tablet, Rfl: 0   cyclobenzaprine (FLEXERIL) 5 MG tablet, Take 1 tablet (5 mg total) by mouth at bedtime as needed for muscle spasms., Disp: 10 tablet, Rfl: 0   ibuprofen (ADVIL) 600 MG tablet, Take 1 tablet (600 mg total) by mouth every 6 (six) hours as needed (pain)., Disp: 40 tablet, Rfl: 0   Medications ordered in this encounter:  Meds ordered this encounter  Medications   amoxicillin-clavulanate (AUGMENTIN) 875-125 MG tablet    Sig: Take 1 tablet by mouth 2 (two) times daily.    Dispense:  20 tablet    Refill:  0    Order Specific Question:   Supervising Provider    Answer:   Merrilee Jansky [id number]     *If you need refills on other medications prior to your next appointment, please contact your pharmacy*  Follow-Up: Call back or seek an in-person evaluation if the symptoms worsen or if the condition fails to improve as anticipated.  [facility] [phone]  Other Instructions  Mastitis  Mastitis is  inflammation of the breast tissue. It occurs most often in women who are breastfeeding, but it can also affect other women, and sometimes even men. Mastitis will sometimes go away on its own. A health care provider will help determine if medical treatment is needed. What are the causes? This condition is usually caused by a bacterial infection. Bacteria can enter the breast tissue through cuts, cracks, or openings in the skin. This usually occurs with breastfeeding because of cracked or irritated nipples. Sometimes, mastitis can occur when there are no cuts or openings in the skin. This is usually caused by plugged milk ducts. Plugged milk ducts block the flow of milk in the breast. Other causes include: Nipple piercing. Some forms of breast cancer. What are the signs or symptoms? Symptoms of this condition include: Swelling, redness, tenderness, and pain in an area of the breast. The area may also feel warm to the touch. These symptoms usually affect the upper part of the breast, toward the armpit region. Swelling of the glands under the arm on the same side. Discharge from the nipple. Fatigue, headache, and flu-like muscle aches. Fever and chills. Nausea and vomiting. Rapid pulse. Symptoms usually last 2-5 days. Breast pain and redness are at their worst on days 2 and 3, and they usually go away by day 5. If an infection is left to worsen, a collection of pus, or an abscess, may develop. How is this diagnosed? This condition can usually be diagnosed based on a physical exam  and your symptoms. You may also have other tests, such as: Blood tests to check if your body is fighting an infection from bacteria. Mammogram or ultrasound tests to rule out other problems or diseases. Fluid tests. If an abscess has developed, the fluid in the abscess may be removed with a needle. The fluid may be checked to see whether bacteria are present. Breast milk testing. A sample of breast milk may be tested for  bacteria. This is done only when breastfeeding or pumping. How is this treated? Mastitis that occurs with breastfeeding will sometimes go away on its own, so your health care provider may choose to wait 24 hours after first seeing you to decide whether a prescription medicine is needed. If treatment is needed, it may include: Continuing to breastfeed or pump from both breasts to allow adequate milk flow and prevent an abscess from forming. Applying hot or cold compresses to the affected area. Medicine for pain. Antibiotic medicine to treat a bacterial infection. Self-care, such as rest and drinking more fluids. Using a needle to remove fluid from an abscess if one has developed. Follow these instructions at home: Breast care  Keep your nipples clean and dry. If directed, apply heat to the affected area of your breast. Use the heat source that your health care provider or lactation specialist recommends. If directed, place ice on the affected area of your breast. To do this: Put ice in a plastic bag. Place a towel between your skin and the bag. Leave the ice on for 20 minutes, 2-3 times a day. Remove the ice if your skin turns bright red. This is very important. If you cannot feel pain, heat, or cold, you have a greater risk of damage to the area. Breastfeeding and pumping tips Continue to breastfeed your baby on demand. This means feeding your baby whenever he or she is hungry. Ask your health care provider or lactation specialist whether you need to change your breastfeeding or pumping routine. Avoid using nipple shields for feedings, if possible. Ask a lactation specialist for assistance if needed. Alternate the breast you offer first at each feeding to make sure your baby feeds from both breasts equally. Offer both breasts to your baby at every feeding. Use gentle breast massage during feeding or pumping sessions only as told by your health care provider or lactation specialist. Avoid  allowing your breasts to become very full with milk (engorged). If your breasts are engorged, you can hand express a small amount of milk for comfort. If you are pumping, continue to pump on the same schedule as you were before. In the breast with mastitis, pump until very little milk is coming out. Do not empty your breast. Emptying your breast causes your body to make more milk and can make symptoms worse. Ask your health care provider or lactation specialist whether you need to change your pumping routine. Medicines Take over-the-counter and prescription medicines as told by your health care provider. If you were prescribed an antibiotic medicine, take it as told by your health care provider. Do not stop using the antibiotic even if you start to feel better. Contact a health care provider if: You have pus-like discharge from the breast. You have a fever. Your symptoms do not improve within 2 days of starting treatment. Get help right away if: Your pain and swelling are getting worse. You have pain that is not controlled with medicine. You have a red line extending from the breast toward your armpit. Summary  Mastitis is inflammation of the breast tissue. It occurs most often in women who are breastfeeding, but it can also affect non-breastfeeding women and some men. This condition is usually caused by a bacterial infection. This condition may be treated with hot and cold compresses, medicines, self-care, and breastfeeding. If you were prescribed an antibiotic medicine, take it as told by your health care provider. Do not stop using the antibiotic even if you start to feel better. This information is not intended to replace advice given to you by your health care provider. Make sure you discuss any questions you have with your health care provider. Document Revised: 06/30/2021 Document Reviewed: 11/26/2019 Elsevier Patient Education  [place].    If you have been instructed to  have an in-person evaluation today at a local Urgent Care facility, please use the link below. It will take you to a list of all of our available [facility], including address, phone number and hours of operation. Please do not delay care.  [facility]  If you or a family member do not have a primary care provider, use the link below to schedule a visit and establish care. When you choose a [facility] primary care physician or advanced practice provider, you gain a long-term partner in health. Find a Primary Care Provider  Learn more about [facility] in-office and virtual care options: [facility]

## 2022-05-17 ENCOUNTER — Inpatient Hospital Stay (HOSPITAL_COMMUNITY)
Admission: AD | Admit: 2022-05-17 | Discharge: 2022-05-17 | Disposition: A | Discharge status: Home / Self Care | Payer: Self-pay | Attending: Obstetrics & Gynecology | Admitting: Obstetrics & Gynecology

## 2022-05-17 ENCOUNTER — Encounter (HOSPITAL_COMMUNITY): Payer: Self-pay | Admitting: Obstetrics and Gynecology

## 2022-05-17 ENCOUNTER — Inpatient Hospital Stay (HOSPITAL_COMMUNITY): Payer: Self-pay

## 2022-05-17 ENCOUNTER — Other Ambulatory Visit: Payer: Self-pay

## 2022-05-17 DIAGNOSIS — O0932 Supervision of pregnancy with insufficient antenatal care, second trimester: Secondary | ICD-10-CM

## 2022-05-17 DIAGNOSIS — O99612 Diseases of the digestive system complicating pregnancy, second trimester: Secondary | ICD-10-CM | POA: Insufficient documentation

## 2022-05-17 DIAGNOSIS — R824 Acetonuria: Secondary | ICD-10-CM | POA: Insufficient documentation

## 2022-05-17 DIAGNOSIS — O208 Other hemorrhage in early pregnancy: Secondary | ICD-10-CM | POA: Insufficient documentation

## 2022-05-17 DIAGNOSIS — N949 Unspecified condition associated with female genital organs and menstrual cycle: Secondary | ICD-10-CM

## 2022-05-17 DIAGNOSIS — A09 Infectious gastroenteritis and colitis, unspecified: Secondary | ICD-10-CM | POA: Insufficient documentation

## 2022-05-17 DIAGNOSIS — R519 Headache, unspecified: Secondary | ICD-10-CM | POA: Insufficient documentation

## 2022-05-17 DIAGNOSIS — O26892 Other specified pregnancy related conditions, second trimester: Secondary | ICD-10-CM | POA: Insufficient documentation

## 2022-05-17 DIAGNOSIS — Z3A15 15 weeks gestation of pregnancy: Secondary | ICD-10-CM | POA: Insufficient documentation

## 2022-05-17 LAB — CBC
HCT: 31.2 % — ABNORMAL LOW (ref 36.0–46.0)
Hemoglobin: 10.4 g/dL — ABNORMAL LOW (ref 12.0–15.0)
MCH: 27.4 pg (ref 26.0–34.0)
MCHC: 33.3 g/dL (ref 30.0–36.0)
MCV: 82.3 fL (ref 80.0–100.0)
Platelets: 240 10*3/uL (ref 150–400)
RBC: 3.79 MIL/uL — ABNORMAL LOW (ref 3.87–5.11)
RDW: 17.1 % — ABNORMAL HIGH (ref 11.5–15.5)
WBC: 7.3 10*3/uL (ref 4.0–10.5)
nRBC: 0 % (ref 0.0–0.2)

## 2022-05-17 LAB — URINALYSIS, ROUTINE W REFLEX MICROSCOPIC
Bilirubin Urine: NEGATIVE
Glucose, UA: NEGATIVE mg/dL
Hgb urine dipstick: NEGATIVE
Ketones, ur: 20 mg/dL — AB
Leukocytes,Ua: NEGATIVE
Nitrite: NEGATIVE
Protein, ur: 30 mg/dL — AB
Specific Gravity, Urine: 1.029 (ref 1.005–1.030)
pH: 5 (ref 5.0–8.0)

## 2022-05-17 LAB — POCT PREGNANCY, URINE: Preg Test, Ur: POSITIVE — AB

## 2022-05-17 LAB — HCG, QUANTITATIVE, PREGNANCY: hCG, Beta Chain, Quant, S: 42579 m[IU]/mL — ABNORMAL HIGH (ref <5)

## 2022-05-17 MED ORDER — ACETAMINOPHEN 325 MG PO TABS: 650.0000 mg | ORAL_TABLET | ORAL | 0 refills | Status: DC | PRN

## 2022-05-17 MED ORDER — ACETAMINOPHEN-CAFFEINE 500-65 MG PO TABS
2.0000 | ORAL_TABLET | Freq: Once | ORAL | Status: AC
  Administered 2022-05-17: 2 via ORAL
  Filled 2022-05-17: qty 2

## 2022-05-17 NOTE — MAU Note (Signed)
.  Elaine Meyer is a 32 y.o. at Unknown here in MAU reporting: lower abdominal pain - will come and go, along with a HA, and N/V since yesterday. States she hasn't been able to keep anything down. Reports 2 episodes of diarrhea today. Positive pregnancy test at home in February. Denies taking any medication for pain.  LMP: 01/28/2022 Onset of complaint: yesterday  Pain score: 4 - abdomen; 6 - HA  Vitals:   05/17/22 2023  BP: 117/62  Pulse: 96  Resp: 18  Temp: 98.7 F (37.1 C)  SpO2: 100%     FHT: RN attempted to doppler - unable to hear heart tones.  Lab orders placed from triage:  urine pregnancy;

## 2022-05-17 NOTE — Discharge Instructions (Signed)

## 2022-05-17 NOTE — MAU Provider Note (Signed)
History     CSN: [id number]  Arrival date and time: 05/17/22 1940   Event Date/Time   First Provider Initiated Contact with Patient 05/17/22 2043      Chief Complaint  Patient presents with   Abdominal Pain   Emesis   Nausea   Headache   Diarrhea   HPI Elaine Meyer is a 32 y.o. [phone] at 63Z 4d by certain LMP. She presents to MAU with multiple complaints:  Diarrhea This is a new problem, onset yesterday. Patient's husband and son are also experiencing reucrrent diarrhea. Most recent episode was at 4pm today. Two total episodes of diarrhea today. She denies exposure to new foods, allergens or irritants. She has not taken medication for this complaint.  Abdominal Pain This is a new problem, onset coinciding with onset of diarrhea. Pain is bilateral and located in her lower abdomen. Pain score is 4/10. She denies aggravating or alleviating factors. She has not taken medication for this complaint.  Headache This is a new problem, onset yesterday. Pain score is 6/10. Pain is anterior and bilateral. She denies aggravating or alleviating factors. She has not taken medication for this complaint.  Nausea and vomiting These are new problems, onset yesterday 05/16/2022.   Patient is planning care with [facility].   OB History     Gravida  4   Para  3   Term  3   Preterm      AB  1   Living  3      SAB  1   IAB      Ectopic      Multiple  0   Live Births  3           Past Medical History:  Diagnosis Date   Abnormal Pap smear    HPV   Vaginal Pap smear, abnormal     Past Surgical History:  Procedure Laterality Date   NO PAST SURGERIES      Family History  Problem Relation Age of Onset   Anemia Mother    Asthma Sister    Anemia Sister    Depression Sister    Asthma Brother    Alcohol abuse Neg Hx    Arthritis Neg Hx    Birth defects Neg Hx    Cancer Neg Hx    COPD Neg Hx    Diabetes Neg Hx    Drug abuse Neg Hx    Early death Neg Hx     Hearing loss Neg Hx    Heart disease Neg Hx    Hyperlipidemia Neg Hx    Hypertension Neg Hx    Kidney disease Neg Hx    Learning disabilities Neg Hx    Mental illness Neg Hx    Mental retardation Neg Hx    Miscarriages / Stillbirths Neg Hx    Stroke Neg Hx    Vision loss Neg Hx    Other Neg Hx     Social History   Tobacco Use   Smoking status: Never   Smokeless tobacco: Never  Vaping Use   Vaping Use: Never used  Substance Use Topics   Alcohol use: No   Drug use: No    Allergies: No Known Allergies  Medications Prior to Admission  Medication Sig Dispense Refill Last Dose   acetaminophen (TYLENOL) 500 MG tablet Take 2 tablets (1,000 mg total) by mouth every 8 (eight) hours as needed (pain). 60 tablet 0    amoxicillin-clavulanate (AUGMENTIN) 875-125 MG  tablet Take 1 tablet by mouth 2 (two) times daily. 20 tablet 0    cyclobenzaprine (FLEXERIL) 5 MG tablet Take 1 tablet (5 mg total) by mouth at bedtime as needed for muscle spasms. 10 tablet 0    ibuprofen (ADVIL) 600 MG tablet Take 1 tablet (600 mg total) by mouth every 6 (six) hours as needed (pain). 40 tablet 0     Review of Systems  Gastrointestinal:  Positive for abdominal pain, diarrhea, nausea and vomiting.  Neurological:  Positive for headaches.  All other systems reviewed and are negative.  Physical Exam   Blood pressure 117/62, pulse 96, temperature 98.7 F (37.1 C), resp. rate 18, height 5\' 4"  (1.626 m), weight 82.6 kg, SpO2 100 %, unknown if currently breastfeeding.  Physical Exam Vitals and nursing note reviewed.  Constitutional:      Appearance: She is well-developed. She is not ill-appearing.  Cardiovascular:     Rate and Rhythm: Normal rate.     Heart sounds: Normal heart sounds.  Pulmonary:     Effort: Pulmonary effort is normal.  Abdominal:     Palpations: Abdomen is soft.     Tenderness: There is no abdominal tenderness.     Comments: Gravid  Skin:    Capillary Refill: Capillary refill  takes less than 2 seconds.  Neurological:     Mental Status: She is alert and oriented to person, place, and time.  Psychiatric:        Mood and Affect: Mood normal.        Behavior: Behavior normal.     MAU Course  Procedures  MDM Orders Placed This Encounter  Procedures   [place] MFM OB LIMITED   hCG, quantitative, pregnancy   CBC   Urinalysis, Routine w reflex microscopic -Urine, Clean Catch   Pregnancy, urine POC   Discharge patient   Patient Vitals for the past 24 hrs:  BP Temp Pulse Resp SpO2 Height Weight  05/17/22 2218 (!) 114/57 -- 74 -- -- -- --  05/17/22 2023 117/62 98.7 F (37.1 C) 96 18 100 % 5\' 4"  (1.626 m) 82.6 kg   Results for orders placed or performed during the hospital encounter of 05/17/22 (from the past 24 hour(s))  Urinalysis, Routine w reflex microscopic -Urine, Clean Catch     Status: Abnormal   Collection Time: 05/17/22  8:40 PM  Result Value Ref Range   Color, Urine AMBER (A) YELLOW   APPearance HAZY (A) CLEAR   Specific Gravity, Urine 1.029 1.005 - 1.030   pH 5.0 5.0 - 8.0   Glucose, UA NEGATIVE NEGATIVE mg/dL   Hgb urine dipstick NEGATIVE NEGATIVE   Bilirubin Urine NEGATIVE NEGATIVE   Ketones, ur 20 (A) NEGATIVE mg/dL   Protein, ur 30 (A) NEGATIVE mg/dL   Nitrite NEGATIVE NEGATIVE   Leukocytes,Ua NEGATIVE NEGATIVE   RBC / HPF 0-5 0 - 5 RBC/hpf   WBC, UA 0-5 0 - 5 WBC/hpf   Bacteria, UA RARE (A) NONE SEEN   Squamous Epithelial / HPF 11-20 0 - 5 /HPF   Mucus PRESENT   Pregnancy, urine POC     Status: Abnormal   Collection Time: 05/17/22  8:41 PM  Result Value Ref Range   Preg Test, Ur POSITIVE (A) NEGATIVE  hCG, quantitative, pregnancy     Status: Abnormal   Collection Time: 05/17/22  9:00 PM  Result Value Ref Range   hCG, Beta Chain, Quant, S 42,579 (H) <5 mIU/mL  CBC     Status: Abnormal  Collection Time: 05/17/22  9:00 PM  Result Value Ref Range   WBC 7.3 4.0 - 10.5 K/uL   RBC 3.79 (L) 3.87 - 5.11 MIL/uL   Hemoglobin 10.4 (L) 12.0  - 15.0 g/dL   HCT 16.131.2 (L) 09.636.0 - 04.546.0 %   MCV 82.3 80.0 - 100.0 fL   MCH 27.4 26.0 - 34.0 pg   MCHC 33.3 30.0 - 36.0 g/dL   RDW 40.917.1 (H) 81.111.5 - 91.415.5 %   Platelets 240 150 - 400 K/uL   nRBC 0.0 0.0 - 0.2 %   Meds ordered this encounter  Medications   acetaminophen-caffeine (EXCEDRIN TENSION HEADACHE) 500-65 MG per tablet 2 tablet   acetaminophen (TYLENOL) 325 MG tablet    Sig: Take 2 tablets (650 mg total) by mouth every 4 (four) hours as needed for up to 30 doses for moderate pain.    Dispense:  60 tablet    Refill:  0    Order Specific Question:   Supervising Provider    Answer:   Adam PhenixARNOLD, JAMES G [[id number]]   Assessment and Plan  --32 y.o. [phone] at with [facility]UP c/w dating by LMP --Subchorionic hematoma, pelvic rest advised --Infectious diarrhea with mild ketonuria  --No vomiting or episodes of diarrhea in MAU --Musculoskeletal abdominal pain, Tylenol PRN, Flexeril declined --Discharge home in stable condition with second trimester precautions  F/U: --Patient's first appointment is [facility] is this Friday 05/21/2022  Calvert CantorSamantha C Loveta Dellis, MSA, MSN, CNM 05/18/2022, 2:20 AM

## 2022-08-02 ENCOUNTER — Other Ambulatory Visit: Payer: Self-pay | Admitting: Family Medicine

## 2022-08-02 DIAGNOSIS — Z3481 Encounter for supervision of other normal pregnancy, first trimester: Secondary | ICD-10-CM

## 2022-08-10 ENCOUNTER — Other Ambulatory Visit: Payer: Self-pay

## 2022-08-10 DIAGNOSIS — Z3481 Encounter for supervision of other normal pregnancy, first trimester: Secondary | ICD-10-CM

## 2022-08-13 LAB — CBC/D/PLT+RPR+RH+ABO+RUBIGG...
Antibody Screen: NEGATIVE
Basophils Absolute: 0 10*3/uL (ref 0.0–0.2)
Basos: 1 %
Bilirubin, UA: NEGATIVE
EOS (ABSOLUTE): 0.2 10*3/uL (ref 0.0–0.4)
Eos: 3 %
Glucose, UA: NEGATIVE
HCV Ab: NONREACTIVE
HIV Screen 4th Generation wRfx: NONREACTIVE
Hematocrit: 30.6 % — ABNORMAL LOW (ref 34.0–46.6)
Hemoglobin: 9.9 g/dL — ABNORMAL LOW (ref 11.1–15.9)
Hepatitis B Surface Ag: NEGATIVE
Immature Grans (Abs): 0 10*3/uL (ref 0.0–0.1)
Immature Granulocytes: 1 %
Ketones, UA: NEGATIVE
Leukocytes,UA: NEGATIVE
Lymphocytes Absolute: 1.8 10*3/uL (ref 0.7–3.1)
Lymphs: 22 %
MCH: 27.9 pg (ref 26.6–33.0)
MCHC: 32.4 g/dL (ref 31.5–35.7)
MCV: 86 fL (ref 79–97)
Monocytes Absolute: 0.6 10*3/uL (ref 0.1–0.9)
Monocytes: 7 %
Neutrophils Absolute: 5.7 10*3/uL (ref 1.4–7.0)
Neutrophils: 66 %
Nitrite, UA: NEGATIVE
Platelets: 235 10*3/uL (ref 150–450)
RBC, UA: NEGATIVE
RBC: 3.55 x10E6/uL — ABNORMAL LOW (ref 3.77–5.28)
RDW: 15.3 % (ref 11.7–15.4)
RPR Ser Ql: NONREACTIVE
Rh Factor: POSITIVE
Rubella Antibodies, IGG: 2.01 index (ref >0.99)
Specific Gravity, UA: 1.024 (ref 1.005–1.030)
Urobilinogen, Ur: 0.2 mg/dL (ref 0.2–1.0)
WBC: 8.4 10*3/uL (ref 3.4–10.8)
pH, UA: 7 (ref 5.0–7.5)

## 2022-08-13 LAB — HCV INTERPRETATION

## 2022-08-13 LAB — URINE CULTURE, OB REFLEX

## 2022-08-13 LAB — HGB FRACTIONATION CASCADE
Hgb A2: 2.4 % (ref 1.8–3.2)
Hgb A: 97.6 % (ref 96.4–98.8)
Hgb F: 0 % (ref 0.0–2.0)
Hgb S: 0 %

## 2022-08-13 LAB — MICROSCOPIC EXAMINATION
Casts: NONE SEEN /lpf
Epithelial Cells (non renal): >10 /hpf — AB (ref 0–10)

## 2022-08-17 ENCOUNTER — Encounter: Payer: Self-pay | Admitting: Family Medicine

## 2022-08-17 ENCOUNTER — Ambulatory Visit (INDEPENDENT_AMBULATORY_CARE_PROVIDER_SITE_OTHER): Payer: Self-pay | Admitting: Family Medicine

## 2022-08-17 VITALS — BP 99/55 | HR 94 | Wt 206.8 lb

## 2022-08-17 DIAGNOSIS — Z3493 Encounter for supervision of normal pregnancy, unspecified, third trimester: Secondary | ICD-10-CM

## 2022-08-17 DIAGNOSIS — Z3490 Encounter for supervision of normal pregnancy, unspecified, unspecified trimester: Secondary | ICD-10-CM | POA: Insufficient documentation

## 2022-08-17 DIAGNOSIS — O0990 Supervision of high risk pregnancy, unspecified, unspecified trimester: Secondary | ICD-10-CM | POA: Insufficient documentation

## 2022-08-17 MED ORDER — FERROUS SULFATE 324 MG PO TBEC: 324.0000 mg | DELAYED_RELEASE_TABLET | ORAL | 0 refills | Status: DC

## 2022-08-17 NOTE — Progress Notes (Signed)
Does have paternal history pulmonary fibrosis in mother and grandmother.

## 2022-08-17 NOTE — Patient Instructions (Addendum)
Go to [facility] Financial Assistance Website to find their application for your ultrasound. I have sent in this order. If the cost is too high, let me know, and I will place the [facility] ultrasound instead.  For information on cost of genetic testing bloodwork through the company [employer], please call or text Ron Parker at [phone].   Follow up in 2 weeks to get your glucose screen, gonorrhea/chlamydia testing, and updated pap smear. Also be sure to bring back your pregnancy medical home form with you.

## 2022-08-17 NOTE — Progress Notes (Signed)
Patient Name: Elaine Meyer Date of Birth: 21-Dec-1990 [facility] Initial Prenatal Visit  Elaine Meyer is a 32 y.o. year old [phone] at [redacted]w[redacted]d who presents for her initial prenatal visit. Pregnancy is not planned but is wanted. She reports fatigue and nausea, though these are better than her previous pregnancy and from earlier in this pregnancy . She is taking a prenatal vitamin.  She denies LOF or vaginal bleeding. Has had 2 episodes of "stretching" pelvic pain that worsened with walking but that resolved on its own. She sometimes feels cramping that she says feels similar to her Braxton-Hicks contractions in previous pregnancies.  Pregnancy Dating: The patient is dated by LMP. EDD 11/04/22. LMP: 01/28/22 Period is certain:  Yes - she uses an app to track the first day of her periods Periods were regular:  Yes.  LMP was a typical period:  Yes.  Using hormonal contraception in 3 months prior to conception: No  Lab Review: Blood type: O Rh Status: + Antibody screen: Negative HIV: Negative RPR: Negative Hemoglobin electrophoresis reviewed: Yes - normal Hgb present Results of OB urine culture are: Negative other than mixed urogenital flora Rubella: Immune Hep C Ab: Negative Varicella status is Immune in 2022  PMH: Reviewed and as detailed below: HTN: No  Gestational Hypertension/preeclampsia: No  Type 1 or 2 Diabetes: No  Depression:  No  Seizure disorder:  No VTE: No ,  History of STI No,  Abnormal Pap smear:  Yes, appears to have been positive for HPV on 03/28/12 and 05/30/13. NILM without HPV testing on 06/05/2020. Genital herpes simplex:  No   PSH: Gynecologic Surgery:  no Surgical history reviewed, notable for: none  Obstetric History: Obstetric history tab updated and reviewed.  Summary of prior pregnancies: all vaginal deliveries, LGA, and post dates due to guidance from physician, shoulder dystocia in last pregnancy in 2022 that  was successfully delivered through, chorioamnionitis on placental pathology but no mention in OB dc summary. Cesarean delivery: No  Gestational Diabetes:  No Hypertension in pregnancy: No History of preterm birth: No History of LGA/SGA infant:  Yes - as above History of shoulder dystocia: Yes - as above Indications for referral were reviewed, and the patient has no obstetric indications for referral to High Risk OB Clinic at this time.   Social History: Partner's name: Rexford Maus  Tobacco use: No Alcohol use:  No Other substance use:  No  Current Medications:  Nature's Own MVI likely without iron  Reviewed and appropriate in pregnancy.   Genetic and Infection Screen: Flow Sheet Updated Yes  Prenatal Exam: Vitals:   08/17/22 0834  BP: (!) 99/55  Pulse: 94    Gen: Well nourished, well developed.  No distress.  Vitals noted. HEENT: Normocephalic, atraumatic.  Full neck ROM. CV: RRR no murmur, gallops or rubs Lungs: CTA B.  Normal respiratory effort without wheezes or rales. Abd: soft, NTND. +BS.  Gravid uterus at 30 cm GU: Deferred today due to time constraints Ext: No appreciable clubbing, cyanosis or edema. Psych: Normal grooming and dress.  Not depressed or anxious appearing.  Normal thought content and process without flight of ideas or looseness of associations  Fetal heart tones: Appropriate - 146 BPM  Assessment/Plan:  Elaine Meyer is a 32 y.o. [phone] at [redacted]w[redacted]d who presents to initiate prenatal care. She is doing well.  Current pregnancy issues include fatigue and dizziness at times, though this has improved. Suspect normal physiologic changes from pregnancy.  No red flags on history or exam.  Routine prenatal care: As dating is reliable, a dating ultrasound has not been ordered. Dating tab updated. However, ordered formal anatomy US given previous US with SCH, CPC, and mild pyelectasis without recommended [place] follow up. Discussed MFM detailed [place] versus AAM  ultrasound; patient will contact patient assistance about this and let me know to proceed with AAM [place] if too expensive. Pre-pregnancy weight updated. Expected weight gain this pregnancy is 25-35 pounds ; patient has gained 21 pounds by [redacted]w[redacted]d. Prenatal labs reviewed, notable for anemia, likely physiologic. Indications for referral to HROB were reviewed and the patient does not meet criteria for referral.  Medication list reviewed and updated.  Bleeding and pain precautions reviewed. Importance of prenatal vitamins reviewed.  Genetic screening offered. Patient opted for: NIPS, patient will contact representative given need for financial assistance The patient has the following indications for aspirinto begin 81 mg at 12-16 weeks: One high risk condition: no single high risk condition  MORE than one moderate risk condition: low SES   Aspirin was not  recommended today based upon above risk factors (one high risk condition or more than one moderate risk factor)  The patient will not be age 86 or over at time of delivery. Referral to genetic counseling was not offered today.  The patient has the following risk factors for preexisting diabetes: Reviewed indications for early 1 hour glucose testing, not indicated . An early 1 hour glucose tolerance test was not ordered. Will obtain normal 1 hr GTT at next visit per patient preference. Pregnancy Medical Home and PHQ-9 forms completed, problems noted: No, will need at next visit  2. Pregnancy issues include the following which were addressed today:  Anemia to Hgb 9.9: iron supplementation prescribed QOD Hx abnormal pap smear: does not remember if obtained colposcopy. Will repeat with HPV testing (along with GC/chlamydia) at next visit.   Follow up 2 weeks for next prenatal visit.  Janeal Holmes, MD [facility]

## 2022-08-19 ENCOUNTER — Telehealth: Payer: Self-pay

## 2022-08-19 DIAGNOSIS — Z3493 Encounter for supervision of normal pregnancy, unspecified, third trimester: Secondary | ICD-10-CM

## 2022-08-19 NOTE — Telephone Encounter (Signed)
Hey Dr. Phineas Real,  The order needs to be changed to Deatiled anatomy or Anatomy complete. Once that is done I can schedule the appt.  FYI anything less than 14 weeks is for conformation [place] and is scheduled at [facility].  Thank you! Melvenia Beam

## 2022-08-19 NOTE — Telephone Encounter (Signed)
Called patient with appointment for Ultrasound.  [place] MFM OB Detail + 14 weeks 09/14/2022 1000  Patient was also given number to reschedule if needed.   Glennie Hawk, CMA

## 2022-08-30 ENCOUNTER — Encounter: Payer: Self-pay | Admitting: Family Medicine

## 2022-08-31 ENCOUNTER — Ambulatory Visit (INDEPENDENT_AMBULATORY_CARE_PROVIDER_SITE_OTHER): Payer: Self-pay | Admitting: Family Medicine

## 2022-08-31 VITALS — BP 123/66 | HR 116 | Wt 211.2 lb

## 2022-08-31 DIAGNOSIS — Z3493 Encounter for supervision of normal pregnancy, unspecified, third trimester: Secondary | ICD-10-CM

## 2022-08-31 MED ORDER — TETANUS-DIPHTH-ACELL PERTUSSIS 5-2.5-18.5 LF-MCG/0.5 IM SUSP: 0.5000 mL | Freq: Once | INTRAMUSCULAR | 0 refills | Status: AC

## 2022-08-31 NOTE — Progress Notes (Signed)
  [facility] Prenatal Visit  [name] is a 32 y.o. [phone] at [redacted]w[redacted]d here for routine follow up. She is dated by LMP.  She reports no complaints. She reports fetal movement. She denies vaginal bleeding, contractions, or loss of fluid. See flow sheet for details.  Vitals:   08/31/22 1358  BP: 123/66  Pulse: (!) 116   General: Alert and oriented, in NAD Skin: Warm, dry, and intact without lesions HEENT: NCAT, EOM grossly normal, midline nasal septum Respiratory: Breathing and speaking comfortably on RA Abdominal: Soft, nontender, gravid Extremities: Moves all extremities grossly equally Neurological: No gross focal deficit Psychiatric: Appropriate mood and affect, PHQ-9 = 6 with negative #9  A/P: Pregnancy at [redacted]w[redacted]d.  Doing well.   Routine prenatal care:  Dating reviewed, dating tab is correct Fetal heart tones Appropriate Fundal height within expected range.  Infant feeding choice: Breastfeeding Contraception choice: Vasectomy Infant circumcision desired no, if female  The patient does not have a history of Cesarean delivery and no referral to Center for [facility] is indicated Influenza vaccine not administered as not influenza season.   Tdap paper Rx with letter to [facility] was given today. 1 hour glucola, CBC, HIV, and RPR were obtained today. Patient is on oral iron supplementation.  Rh status was reviewed and patient does not need Rhogam.  Rhogam was not given today.  Pregnancy medical home and PHQ-9 forms were done today and reviewed.   Childbirth and education classes were offered; patient politely declined.  Preterm labor and fetal movement precautions reviewed.  2. Pregnancy issues include the following and were addressed as appropriate today:   NIPS: reiterated number to call financial representative if desired  Anatomy US: scheduled for 09/14/2022 at 10 AM  Problem list and pregnancy box updated: Yes.   Follow up with me in 2 weeks on  09/17/2022. Patient scheduled in [facility] during third trimester on 09/30/2022.  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  Elaine Holmes, MD

## 2022-08-31 NOTE — Patient Instructions (Addendum)
It was great to see you today! Here's what we talked about:  We obtained a couple tests today. I will keep you updated on those. Come back in 2 weeks to ensure you are still doing well. You will also see the supervising physicians at the end of August.  For information on cost of genetic testing bloodwork through the company [employer], please call or text Ron Parker at [phone].   Please let me know if you have any other questions.  Dr. Phineas Real

## 2022-09-01 LAB — CBC
Hematocrit: 30.7 % — ABNORMAL LOW (ref 34.0–46.6)
Hemoglobin: 10.1 g/dL — ABNORMAL LOW (ref 11.1–15.9)
MCH: 29.1 pg (ref 26.6–33.0)
Platelets: 210 10*3/uL (ref 150–450)
RBC: 3.47 x10E6/uL — ABNORMAL LOW (ref 3.77–5.28)
RDW: 15.8 % — ABNORMAL HIGH (ref 11.7–15.4)
WBC: 8.5 10*3/uL (ref 3.4–10.8)

## 2022-09-01 LAB — HIV ANTIBODY (ROUTINE TESTING W REFLEX): HIV Screen 4th Generation wRfx: NONREACTIVE

## 2022-09-01 LAB — RPR W/REFLEX TO TREPSURE

## 2022-09-01 LAB — GLUCOSE, 1 HOUR GESTATIONAL: Gestational Diabetes Screen: 139 mg/dL (ref 70–139)

## 2022-09-02 ENCOUNTER — Telehealth: Payer: Self-pay | Admitting: Family Medicine

## 2022-09-02 DIAGNOSIS — O24419 Gestational diabetes mellitus in pregnancy, unspecified control: Secondary | ICD-10-CM

## 2022-09-02 DIAGNOSIS — R7309 Other abnormal glucose: Secondary | ICD-10-CM

## 2022-09-02 DIAGNOSIS — Z3493 Encounter for supervision of normal pregnancy, unspecified, third trimester: Secondary | ICD-10-CM

## 2022-09-02 LAB — CBC
MCHC: 32.9 g/dL (ref 31.5–35.7)
MCV: 89 fL (ref 79–97)

## 2022-09-02 LAB — TREPONEMAL ANTIBODIES, TPPA: Treponemal Antibodies, TPPA: NONREACTIVE

## 2022-09-02 NOTE — Telephone Encounter (Addendum)
Called patient to discuss recent prenatal results. Anemia likely physiologic with some component of IDA; it has improved with iron supplementation. HIV negative. RPR pending. Advised since 1 hr GTT >135, I recommend 3 hr GTT. Patient scheduled for 09/06/22 at 8:30 AM. She is aware multiple glucoses will be taken and that she should be fasting.  ADDENDUM 09/08/22: Attempted to call patient regarding GDM diagnosis without answer; MyChart message sent. Glucose monitoring supplies prescribed. Instructed to take values fasting before breakfast and 1 hour after each meal. Goal CBGs <90 fasting and <140 1 hour postprandial. If more than 50%, consider adding metformin. Will also refer to high-risk OB given GDM diagnosis.

## 2022-09-02 NOTE — Addendum Note (Signed)
Addended by: Evette Georges B on: 09/02/2022 01:33 PM   Modules accepted: Orders

## 2022-09-06 ENCOUNTER — Other Ambulatory Visit (INDEPENDENT_AMBULATORY_CARE_PROVIDER_SITE_OTHER): Payer: Self-pay

## 2022-09-06 DIAGNOSIS — Z3493 Encounter for supervision of normal pregnancy, unspecified, third trimester: Secondary | ICD-10-CM

## 2022-09-06 DIAGNOSIS — R7309 Other abnormal glucose: Secondary | ICD-10-CM

## 2022-09-06 LAB — POCT CBG (FASTING - GLUCOSE)-MANUAL ENTRY: Glucose Fasting, POC: 108 mg/dL — AB (ref 70–99)

## 2022-09-08 MED ORDER — LANCETS MISC. MISC: 1.0000 | Freq: Three times a day (TID) | 0 refills | Status: AC

## 2022-09-08 MED ORDER — BLOOD GLUCOSE MONITORING SUPPL DEVI: 1.0000 | Freq: Three times a day (TID) | 0 refills | Status: DC

## 2022-09-08 MED ORDER — LANCET DEVICE MISC: 1.0000 | Freq: Three times a day (TID) | 0 refills | Status: AC

## 2022-09-08 MED ORDER — BLOOD GLUCOSE TEST VI STRP: 1.0000 | ORAL_STRIP | Freq: Three times a day (TID) | 0 refills | Status: AC

## 2022-09-08 NOTE — Addendum Note (Signed)
Addended by: Evette Georges B on: 09/08/2022 09:11 AM   Modules accepted: Orders

## 2022-09-08 NOTE — Progress Notes (Addendum)
Attempted to call patient to discuss GDM diagnosis without answer. Of note, patient has history of LGA infant and shoulder dystocia. I sent a MyChart message with information about GDM, dietary recommendations, and to let me know once she received the message. Will send in glucose monitor/lancets/strips and review glucoses at next appointment. Advised to write down sugar before breakfast and also 1 hour after each meal. Follow up at next visit.

## 2022-09-08 NOTE — Addendum Note (Signed)
Addended by: Evette Georges B on: 09/08/2022 02:28 PM   Modules accepted: Orders

## 2022-09-10 DIAGNOSIS — O99210 Obesity complicating pregnancy, unspecified trimester: Secondary | ICD-10-CM | POA: Insufficient documentation

## 2022-09-10 DIAGNOSIS — O3503X0 Maternal care for (suspected) central nervous system malformation or damage in fetus, choroid plexus cysts, not applicable or unspecified: Secondary | ICD-10-CM | POA: Insufficient documentation

## 2022-09-10 DIAGNOSIS — O35EXX0 Maternal care for other (suspected) fetal abnormality and damage, fetal genitourinary anomalies, not applicable or unspecified: Secondary | ICD-10-CM | POA: Insufficient documentation

## 2022-09-10 DIAGNOSIS — O24419 Gestational diabetes mellitus in pregnancy, unspecified control: Secondary | ICD-10-CM | POA: Insufficient documentation

## 2022-09-10 DIAGNOSIS — O418X90 Other specified disorders of amniotic fluid and membranes, unspecified trimester, not applicable or unspecified: Secondary | ICD-10-CM | POA: Insufficient documentation

## 2022-09-14 ENCOUNTER — Ambulatory Visit: Payer: Self-pay | Attending: Family Medicine

## 2022-09-14 ENCOUNTER — Ambulatory Visit: Payer: Self-pay | Admitting: *Deleted

## 2022-09-14 ENCOUNTER — Other Ambulatory Visit: Payer: Self-pay | Admitting: *Deleted

## 2022-09-14 VITALS — BP 106/55 | HR 80

## 2022-09-14 DIAGNOSIS — O3503X0 Maternal care for (suspected) central nervous system malformation or damage in fetus, choroid plexus cysts, not applicable or unspecified: Secondary | ICD-10-CM

## 2022-09-14 DIAGNOSIS — O418X90 Other specified disorders of amniotic fluid and membranes, unspecified trimester, not applicable or unspecified: Secondary | ICD-10-CM

## 2022-09-14 DIAGNOSIS — O35EXX0 Maternal care for other (suspected) fetal abnormality and damage, fetal genitourinary anomalies, not applicable or unspecified: Secondary | ICD-10-CM | POA: Insufficient documentation

## 2022-09-14 DIAGNOSIS — O09293 Supervision of pregnancy with other poor reproductive or obstetric history, third trimester: Secondary | ICD-10-CM | POA: Insufficient documentation

## 2022-09-14 DIAGNOSIS — O3660X0 Maternal care for excessive fetal growth, unspecified trimester, not applicable or unspecified: Secondary | ICD-10-CM

## 2022-09-14 DIAGNOSIS — O24419 Gestational diabetes mellitus in pregnancy, unspecified control: Secondary | ICD-10-CM

## 2022-09-14 DIAGNOSIS — Z3493 Encounter for supervision of normal pregnancy, unspecified, third trimester: Secondary | ICD-10-CM | POA: Insufficient documentation

## 2022-09-14 DIAGNOSIS — O358XX0 Maternal care for other (suspected) fetal abnormality and damage, not applicable or unspecified: Secondary | ICD-10-CM | POA: Insufficient documentation

## 2022-09-14 DIAGNOSIS — O99210 Obesity complicating pregnancy, unspecified trimester: Secondary | ICD-10-CM | POA: Insufficient documentation

## 2022-09-14 DIAGNOSIS — O24410 Gestational diabetes mellitus in pregnancy, diet controlled: Secondary | ICD-10-CM

## 2022-09-14 DIAGNOSIS — O0933 Supervision of pregnancy with insufficient antenatal care, third trimester: Secondary | ICD-10-CM

## 2022-09-14 DIAGNOSIS — O283 Abnormal ultrasonic finding on antenatal screening of mother: Secondary | ICD-10-CM | POA: Insufficient documentation

## 2022-09-14 DIAGNOSIS — Z3A32 32 weeks gestation of pregnancy: Secondary | ICD-10-CM | POA: Insufficient documentation

## 2022-09-14 DIAGNOSIS — O468X9 Other antepartum hemorrhage, unspecified trimester: Secondary | ICD-10-CM | POA: Insufficient documentation

## 2022-09-14 DIAGNOSIS — Z363 Encounter for antenatal screening for malformations: Secondary | ICD-10-CM | POA: Insufficient documentation

## 2022-09-14 DIAGNOSIS — O99213 Obesity complicating pregnancy, third trimester: Secondary | ICD-10-CM | POA: Insufficient documentation

## 2022-09-17 ENCOUNTER — Ambulatory Visit (INDEPENDENT_AMBULATORY_CARE_PROVIDER_SITE_OTHER): Payer: Self-pay | Admitting: Family Medicine

## 2022-09-17 VITALS — BP 104/60 | HR 93 | Wt 208.2 lb

## 2022-09-17 DIAGNOSIS — O24410 Gestational diabetes mellitus in pregnancy, diet controlled: Secondary | ICD-10-CM

## 2022-09-17 DIAGNOSIS — O0993 Supervision of high risk pregnancy, unspecified, third trimester: Secondary | ICD-10-CM

## 2022-09-17 NOTE — Patient Instructions (Signed)
It was great to see you today! Here's what we talked about:  I am so happy to hear everything is going great! I will reach out to the OB/GYN about getting you in. Keep up the great work with diet. Try to measure you blood sugars before breakfast and 1 hour after EACH meal  Please let me know if you have any other questions.  Dr. Phineas Real

## 2022-09-17 NOTE — Progress Notes (Signed)
  [facility] Prenatal Visit  [name] Elaine Meyer is a 32 y.o. [phone] at [redacted]w[redacted]d here for routine follow up. She is dated by LMP.  She reports {symptoms; pregnancy related:14538}.  She reports fetal movement. She denies vaginal bleeding, contractions, or loss of fluid.  See flow sheet for details.  Vitals:   09/17/22 1456  BP: 104/60  Pulse: 93    General: Alert and oriented, in NAD Skin: Warm, dry, and intact without lesions HEENT: NCAT, EOM grossly normal, midline nasal septum Cardiac: RRR, no m/r/g appreciated Respiratory: CTAB, breathing and speaking comfortably on RA Abdominal: Soft, nontender, nondistended, normoactive bowel sounds Extremities: Moves all extremities grossly equally Neurological: No gross focal deficit Psychiatric: Appropriate mood and affect  A/P: Pregnancy at [redacted]w[redacted]d.  Doing well.   Routine prenatal care:  Dating reviewed, dating tab is {correct:23336::"correct"} Fetal heart tones: {appropriate:23337} Fundal height: {fundal height:23342::"within expected range. "} The patient {does/does not:200015::"does not"} have a history of HSV and valacyclovir {ACTION; IS/IS NOT:21021397::"is not"} indicated at this time.  The patient does not have a history of Cesarean delivery and no referral to Center for [facility] is indicated for this Infant feeding choice: Breastfeeding Contraception choice: Vasectomy Infant circumcision desired: No Influenza vaccine not administered as not influenza season.   Tdap {was/was not:19854::"was"} given today. COVID vaccination was discussed and ***.  Childbirth and education classes were offered; patient politely declined Pregnancy education regarding benefits of breastfeeding, contraception, fetal growth, expected weight gain, and safe infant sleep were discussed.  Preterm labor and fetal movement precautions reviewed.   2. Pregnancy issues include the following and were addressed as appropriate  today:  [facility]ap: [facility] at [facility]*** GDM: glucose control not able. Not followed with MFM yet due to referral process ***. Anatomy US with persistent ***  Problem list and pregnancy box updated: {yes/no:20286::"Yes"}.   Patient scheduled in [facility] during third trimester on 09/30/2022. 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  Follow up 2 weeks   Elaine Holmes, MD

## 2022-09-18 ENCOUNTER — Encounter: Payer: Self-pay | Admitting: Family Medicine

## 2022-09-28 IMAGING — US US OB COMP LESS 14 WK
1 series · 15 of 25 positions shown · non-contrast
Comparison: None.

CLINICAL DATA: Pregnancy of unknown location

EXAM:
OBSTETRIC <14 WK ULTRASOUND
TECHNIQUE: Transabdominal ultrasound was performed for evaluation of the
gestation as well as the maternal uterus and adnexal regions.

[Series 1: us ob comp less 14 wk · 25 acquisitions, 15 frames shown]
[im 1/25]
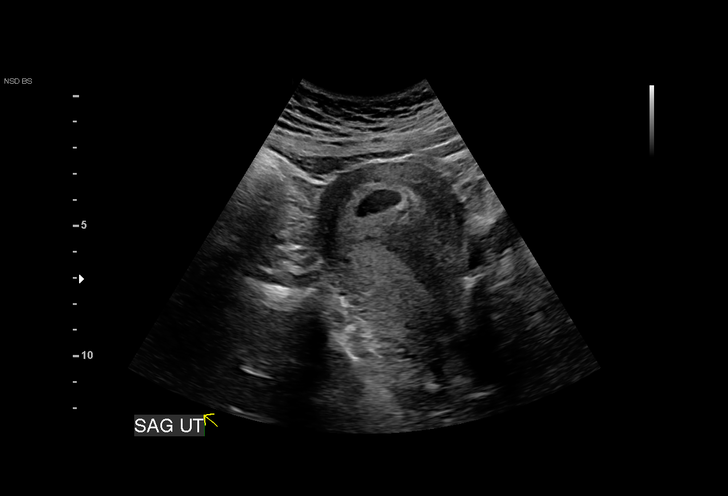
[im 3/25]
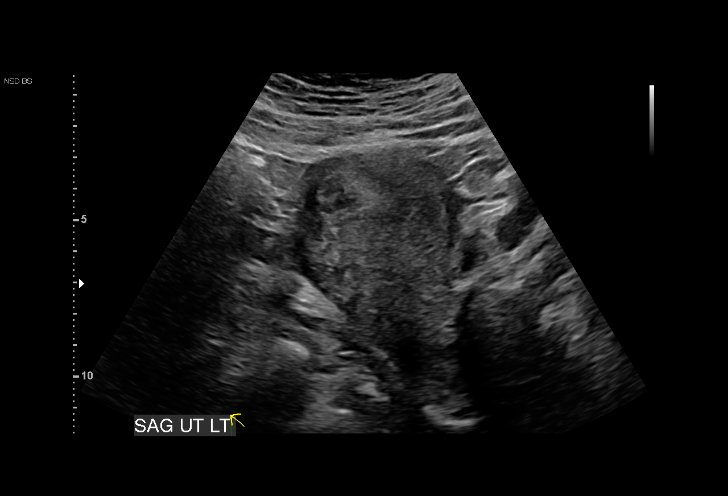
[im 5/25]
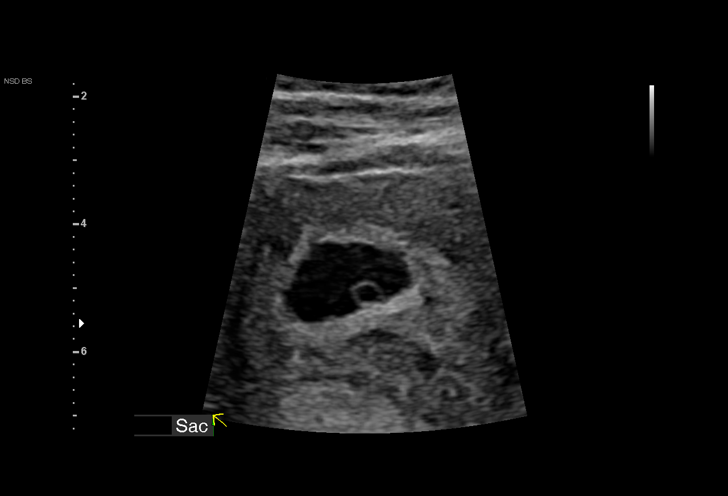
[im 6/25]
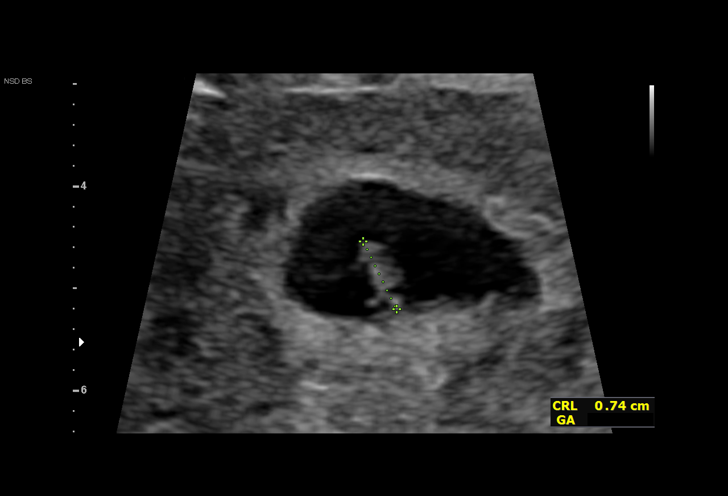
[im 8/25]
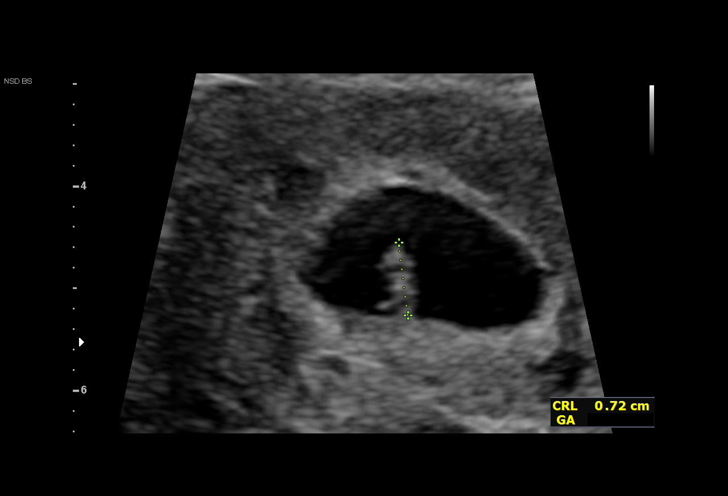
[im 10/25]
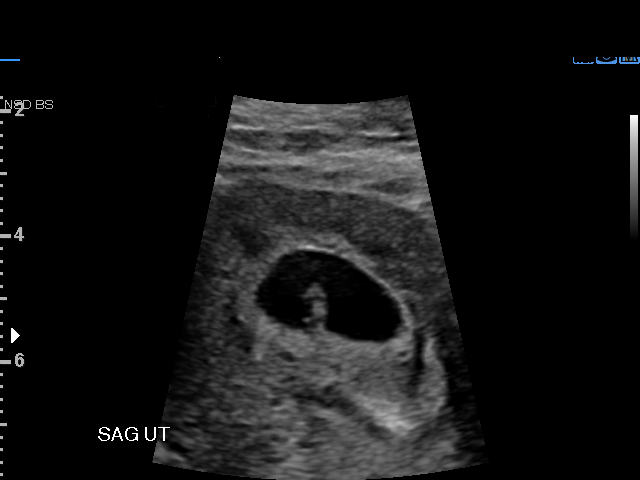
[im 11/25]
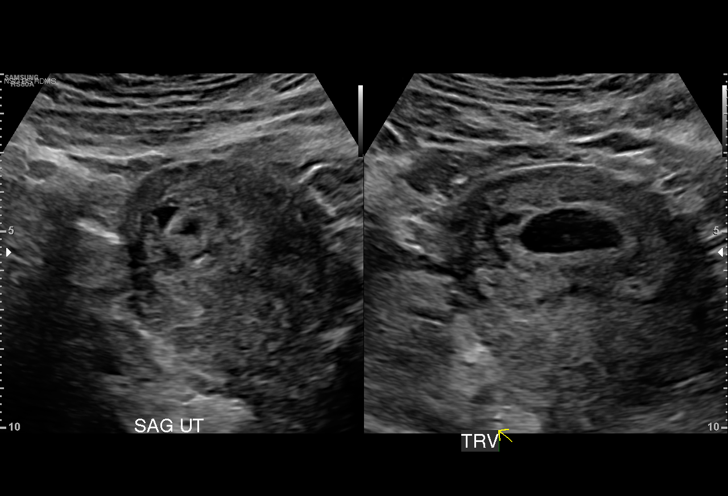
[im 13/25]
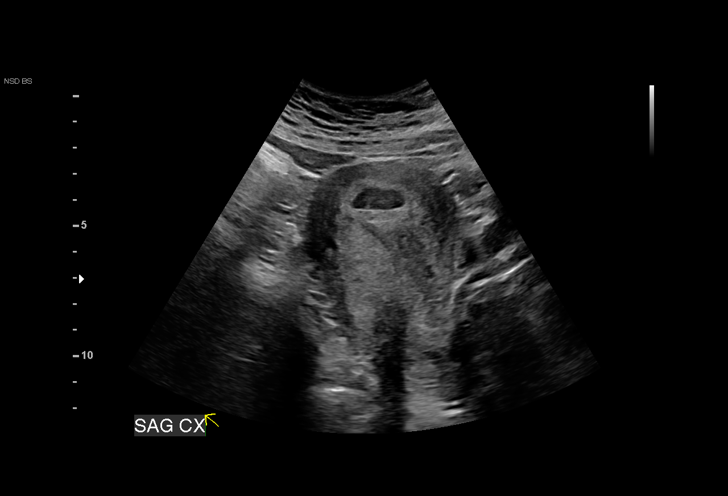
[im 15/25]
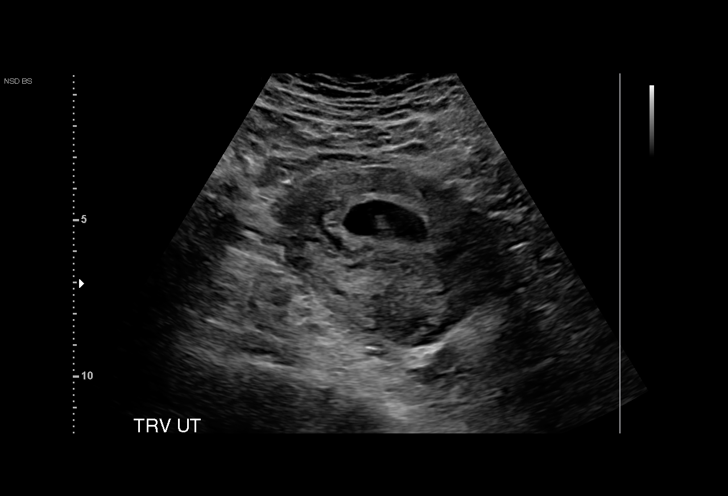
[im 16/25]
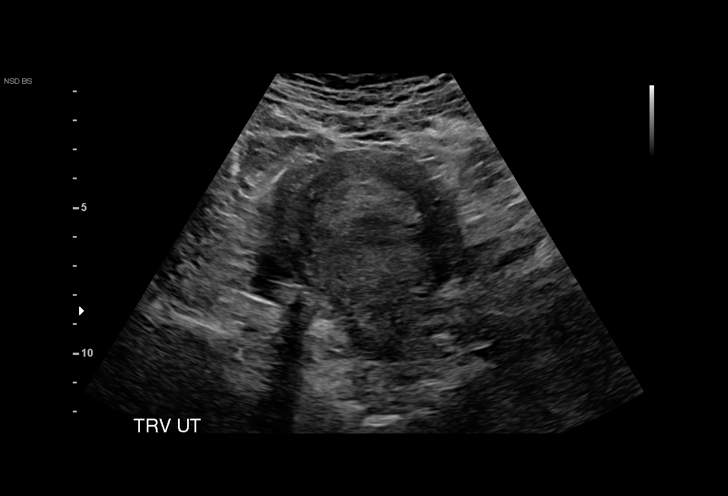
[im 18/25]
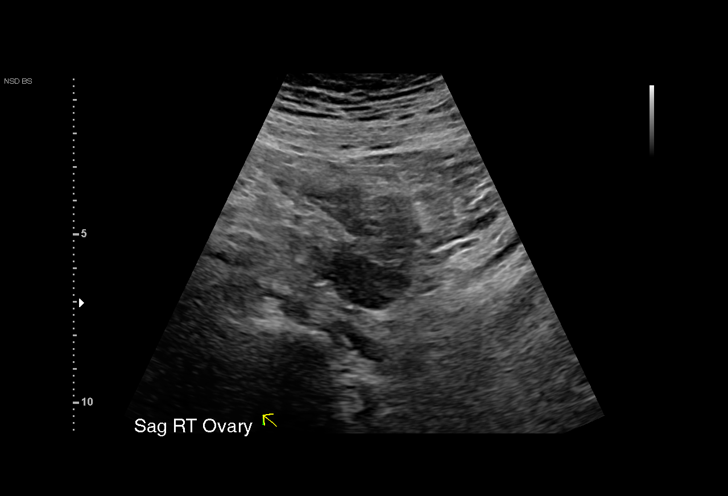
[im 20/25]
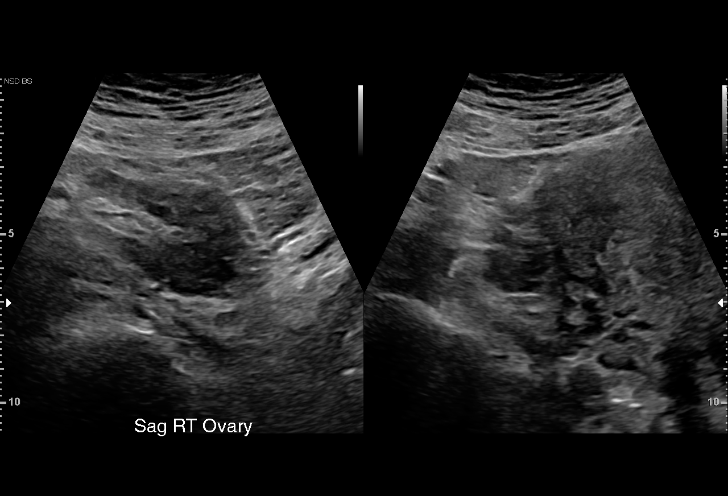
[im 21/25]
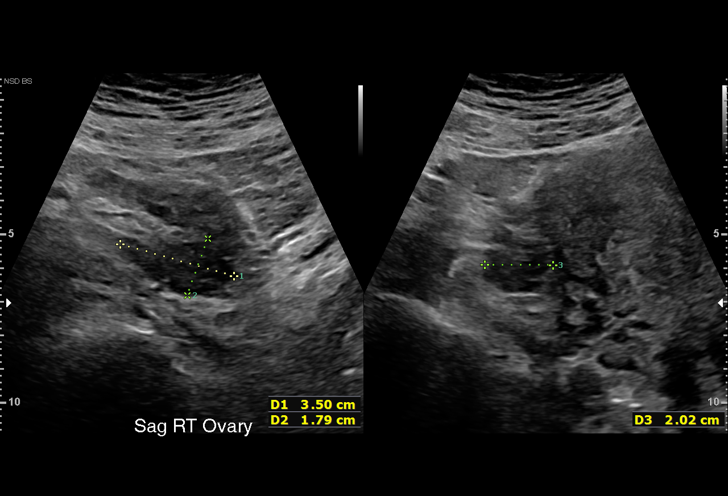
[im 23/25]
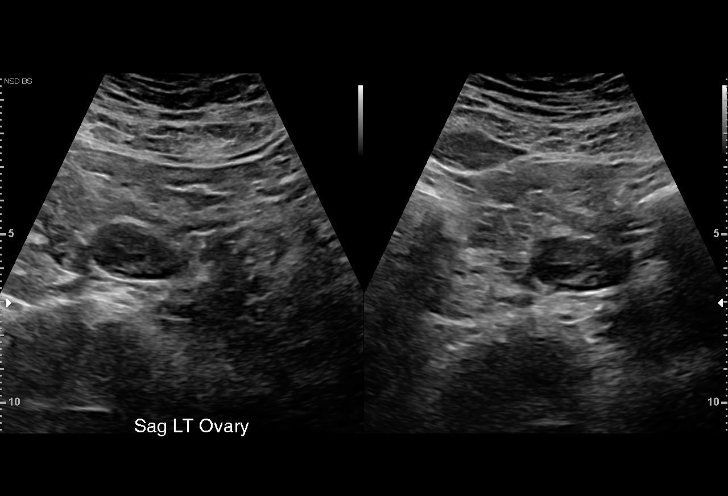
[im 25/25]
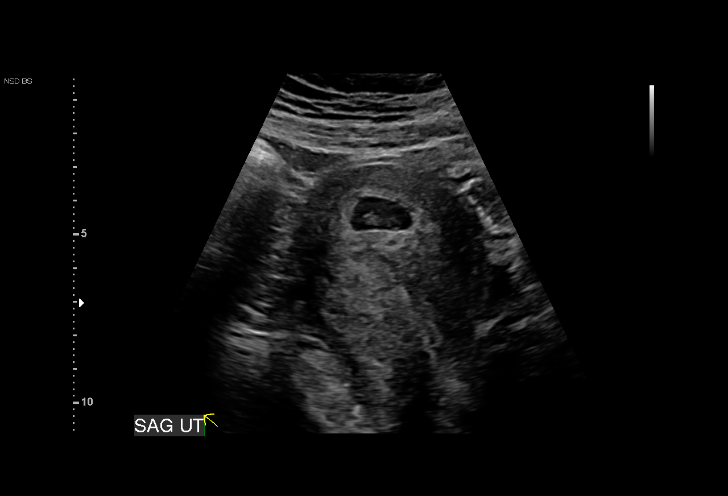

[15 of 25 positions shown; findings below may reference images not displayed]

FINDINGS: Intrauterine gestational sac: Single

Yolk sac:  Visualized.

Embryo:  Visualized.

Cardiac Activity: Visualized.

Heart Rate: 127 bpm

CRL:   7.5 mm   6 w 4 d                  US EDC: 11/26/2020

Subchorionic hemorrhage: Small subchorionic hemorrhage is seen.

Maternal uterus/adnexae:   Normal appearing ovaries.
IMPRESSION: Single live intrauterine pregnancy measuring 6 weeks 4 days

## 2022-09-30 ENCOUNTER — Ambulatory Visit (INDEPENDENT_AMBULATORY_CARE_PROVIDER_SITE_OTHER): Payer: Self-pay | Admitting: Family Medicine

## 2022-09-30 VITALS — BP 100/60 | HR 81 | Wt 207.0 lb

## 2022-09-30 DIAGNOSIS — Z8759 Personal history of other complications of pregnancy, childbirth and the puerperium: Secondary | ICD-10-CM

## 2022-09-30 DIAGNOSIS — O0993 Supervision of high risk pregnancy, unspecified, third trimester: Secondary | ICD-10-CM

## 2022-09-30 DIAGNOSIS — O24410 Gestational diabetes mellitus in pregnancy, diet controlled: Secondary | ICD-10-CM

## 2022-09-30 DIAGNOSIS — Z8742 Personal history of other diseases of the female genital tract: Secondary | ICD-10-CM

## 2022-09-30 MED ORDER — BLOOD GLUCOSE MONITOR KIT
PACK | 0 refills | Status: DC
Start: 2022-09-30 — End: 2022-10-23

## 2022-09-30 NOTE — Patient Instructions (Addendum)
We have scheduled you a follow up appointment with Dr Linwood Dibbles on October 08, 2022 at 8:50 AM, please bring your blood sugar log to that appointment.  For your blood sugar:  Please check fasting (nothing to eat or drink) and 2 hours after eating Goals: Fasting < 90 2 hours after eating < 120 Please bring the blood sugar records to your follow up appointment  Prenatal Classes Go to [email or web address] for more information on the pregnancy and child birth classes that [place] has to offer.   Pregnancy Related Return Precautions The follow are signs/symptoms that are abnormal in pregnancy and may require further evaluation by a physician: Go to the MAU at [facility] if: You have cramping/contractions that do not go away with drinking water, especially if they are lasting 30 seconds to 1.5 minutes, coming and going every 5-10 minutes for an hour or more, or are getting stronger and you cannot walk or talk while having a contraction/cramp. Your water breaks.  Sometimes it is a big gush of fluid, sometimes it is just a trickle that keeps getting your underwear wet or running down your legs You have vaginal bleeding.    You do not feel your baby moving like normal.  If you do not, get something to eat and drink (something cold or something with sugar like peanut butter or juice) and lay down and focus on feeling your baby move. If your baby is still not moving like normal, you should go to MAU. You should feel your baby move 6 times in one hour, or 10 times in two hours. You have a persistent headache that does not go away with 1 g of Tylenol, vision changes, chest pain, difficulty breathing, severe pain in your right upper abdomen, worsening leg swelling- these can all be signs of high blood pressure in pregnancy and need to be evaluated by a provider immediately  These are all concerning in pregnancy and if you have any of these I  recommend you call your PCP and present to the Maternity Admissions Unit (map below) for further evaluation.  For any pregnancy-related emergencies, please go to the Maternity Admissions Unit in the [facility] at [facility]. You will use hospital Entrance C.    Our clinic number is [phone].   Dr Miquel Dunn

## 2022-10-01 DIAGNOSIS — Z8742 Personal history of other diseases of the female genital tract: Secondary | ICD-10-CM | POA: Insufficient documentation

## 2022-10-01 DIAGNOSIS — Z8759 Personal history of other complications of pregnancy, childbirth and the puerperium: Secondary | ICD-10-CM | POA: Insufficient documentation

## 2022-10-01 NOTE — Progress Notes (Signed)
[facility] Visit  Ma Lawerance Sabal Elaine Meyer is a 32 y.o. [phone] at [redacted]w[redacted]d (via LMP c/w 16 wk sono) who presents to [facility] for routine follow up. Seen today along with resident Dr. Mliss Sax. Prenatal course, history, notes, ultrasounds, and laboratory results reviewed.  Denies cramping/ctx, fluid leaking, vaginal bleeding, or decreased fetal movement. Taking PNV, Tums, and ferrous sulfate.    Primary Prenatal Care Provider: Dr Phineas Real  Postpartum Plans: - delivery planning: unsure, consider C-section - circumcision: declines - feeding: breast - pediatrician: TAPM - contraception: considering vasectomy  FHR: 140 bpm Uterine size: 37 cm  Assessment & Plan  1. Routine prenatal care: - Unclear dating- dating pt notes an unsure LMP - will touch base with Dr Grace Bushy of MFM but with unsure LMP possible change to dating by 16 wk [place] from 05/18/22 - received Tdap at the health department  2. Gestational DM- pt notes she was not able to pick up glucometer yet. Given printed script in clinic today and instructed to pick up and start checking BG. Goal BG: fasting < 90 and 2 hr PP < 120 - if > 50% elevated recommend starting metformin. Has high risk OB appt scheduled on 10/13/22 for transfer of care, will see [place] next week to monitor BG while awaiting transfer to OB. She has growth [place] scheduled on 10/14/22 with MFM.   3. History of shoulder dystocia- pt with history of shoulder dystocia with her previous delivery, at [redacted]w[redacted]d, with McRoberts and rotational maneuvers needed to deliver baby. Discussed with her in detail risk of repeat shoulder dystocia, especially with gestational diabetes, and that I would not recommend going past her due date again. She has consultation with MFM with her next [place] but based on [place] EFW consider induction at 39 WGA or sooner per OB/MFM recommendations. Also discussed option of elective Cesarean section to avoid recurrent shoulder  dystocia. Discussed risks of shoulder dystocia in detail including brachial plexus injury, fetal hypoxia, fetal death. She would like to see what next US shows and discsus with OB about timing/method of delivery. All questions and concerns addressed.   4. Anemia of pregnancy- Hgb 10.1, continue iron.  5. Fetal pyectasis, chorioid plexus cysts of fetus- has consultation and repeat US with MFM on 10/14/22. Has financial aid information to check on cost of NIPT. 6. History of abnormal pap smear- most recent 06/05/20 NILM per [facility]. Previously ASCUS + HPV 05/30/2013 and ACUS + HPV 03/28/2012. Per ASCCP guidelines colposcopy recommended postpartum.   Next prenatal visit in 1 weeks- sono for vertex, needs GBS/GC/chlamydia, check BG log, if > 50% levels elevated (fasting goal < 90 and 2 hr PP goal < 120), recommend starting metformin 500mg  daily. Please give dates/times for f/u high risk OB appt and [place] appt . Labor & fetal movement precautions discussed.  Burley Saver, MD [facility]

## 2022-10-08 ENCOUNTER — Encounter: Payer: Self-pay | Admitting: Family Medicine

## 2022-10-08 ENCOUNTER — Other Ambulatory Visit (HOSPITAL_COMMUNITY)
Admission: RE | Admit: 2022-10-08 | Discharge: 2022-10-08 | Disposition: A | Discharge status: Home / Self Care | Payer: Self-pay | Source: Ambulatory Visit | Attending: Family Medicine | Admitting: Family Medicine

## 2022-10-08 ENCOUNTER — Ambulatory Visit (INDEPENDENT_AMBULATORY_CARE_PROVIDER_SITE_OTHER): Payer: Self-pay | Admitting: Family Medicine

## 2022-10-08 VITALS — BP 112/60 | HR 78 | Wt 209.8 lb

## 2022-10-08 DIAGNOSIS — Z8742 Personal history of other diseases of the female genital tract: Secondary | ICD-10-CM

## 2022-10-08 DIAGNOSIS — O0993 Supervision of high risk pregnancy, unspecified, third trimester: Secondary | ICD-10-CM | POA: Insufficient documentation

## 2022-10-08 DIAGNOSIS — Z8759 Personal history of other complications of pregnancy, childbirth and the puerperium: Secondary | ICD-10-CM

## 2022-10-08 DIAGNOSIS — O3503X0 Maternal care for (suspected) central nervous system malformation or damage in fetus, choroid plexus cysts, not applicable or unspecified: Secondary | ICD-10-CM

## 2022-10-08 DIAGNOSIS — O24410 Gestational diabetes mellitus in pregnancy, diet controlled: Secondary | ICD-10-CM

## 2022-10-08 DIAGNOSIS — O99210 Obesity complicating pregnancy, unspecified trimester: Secondary | ICD-10-CM

## 2022-10-08 NOTE — Progress Notes (Unsigned)
  [facility] Prenatal Visit  [name] Elaine Meyer is a 32 y.o. [phone] at [redacted]w[redacted]d here for routine follow up. She is dated by LMP c/w 16wk [place].  She reports {symptoms; pregnancy related:14538}. She reports fetal movement. She denies vaginal bleeding, contractions, or loss of fluid. See flow sheet for details.  GDM - given script for BG monitor at last visit. Has risk OB appt scheduled 9/4. Growth [place] scheduled 9/5.  ***sono for vertex, needs GBS/GC/chlamydia, check BG log, if > 50% levels elevated (fasting goal < 90 and 2 hr PP goal < 120), recommend starting metformin 500mg  daily. Please give dates/times for f/u high risk OB appt and [place] appt.   Vitals:   10/08/22 0850  BP: 112/60  Pulse: 78    A/P: Pregnancy at [redacted]w[redacted]d.  Doing well.   Routine prenatal care  Dating reviewed, dating tab is {correct:23336::"correct"} Fetal heart tones {appropriate:23337} Fundal height {fundal height:23342::"within expected range. "} Fetal position confirmed {vertex:23350::"Vertex"} using {ultrasound or [place]:23351::"Ultrasound "}.  GBS {collected today :23349::"collected today. "}.  Repeat GC/CT {collected today :23349::"collected today. "} The patient does not have a history of HSV and valacyclovir is not indicated at this time.  Infant feeding choice: Breastfeeding Contraception choice: vasectomy Infant circumcision desired no Influenza vaccine not administered as not influenza season.   Tdap previously administered between 27-36 weeks  COVID vaccination was discussed and ***.  Pregnancy education regarding preterm labor, fetal movement,  benefits of breastfeeding, contraception, fetal growth, expected weight gain, and safe infant sleep were discussed.    2. Pregnancy issues include the following and were addressed as appropriate today:   ***  Problem list and pregnancy box updated: {yes/no:20286::"Yes"}.  Follow up 1 week.

## 2022-10-08 NOTE — Patient Instructions (Addendum)
It was great to see you!  Our plans for today:  - Get the blood sugar monitor from your pharmacy. Check your blood sugars over the weekend (first thing in the morning before eating and 2 hours after eating). Call us if there are any blood sugars greater than 90 in the morning and greater than 120 2 hours after eating.  - See below for your appointment information for your ultrasound and OB appointments for next week.   We are checking some labs today, we will release these results to your MyChart.  Dr. Linwood Dibbles

## 2022-10-12 LAB — CULTURE, BETA STREP (GROUP B ONLY): Strep Gp B Culture: NEGATIVE

## 2022-10-13 ENCOUNTER — Ambulatory Visit (INDEPENDENT_AMBULATORY_CARE_PROVIDER_SITE_OTHER): Payer: Self-pay

## 2022-10-13 ENCOUNTER — Ambulatory Visit (INDEPENDENT_AMBULATORY_CARE_PROVIDER_SITE_OTHER): Payer: Self-pay | Admitting: Obstetrics and Gynecology

## 2022-10-13 ENCOUNTER — Other Ambulatory Visit: Payer: Self-pay

## 2022-10-13 VITALS — BP 123/73 | HR 120 | Wt 209.2 lb

## 2022-10-13 DIAGNOSIS — Z3A36 36 weeks gestation of pregnancy: Secondary | ICD-10-CM

## 2022-10-13 DIAGNOSIS — O288 Other abnormal findings on antenatal screening of mother: Secondary | ICD-10-CM | POA: Insufficient documentation

## 2022-10-13 DIAGNOSIS — O24410 Gestational diabetes mellitus in pregnancy, diet controlled: Secondary | ICD-10-CM

## 2022-10-13 DIAGNOSIS — Z8742 Personal history of other diseases of the female genital tract: Secondary | ICD-10-CM

## 2022-10-13 DIAGNOSIS — Z8759 Personal history of other complications of pregnancy, childbirth and the puerperium: Secondary | ICD-10-CM

## 2022-10-13 DIAGNOSIS — Z6836 Body mass index (BMI) 36.0-36.9, adult: Secondary | ICD-10-CM

## 2022-10-13 DIAGNOSIS — O35EXX0 Maternal care for other (suspected) fetal abnormality and damage, fetal genitourinary anomalies, not applicable or unspecified: Secondary | ICD-10-CM

## 2022-10-13 DIAGNOSIS — O99213 Obesity complicating pregnancy, third trimester: Secondary | ICD-10-CM

## 2022-10-13 DIAGNOSIS — O0993 Supervision of high risk pregnancy, unspecified, third trimester: Secondary | ICD-10-CM

## 2022-10-13 LAB — GLUCOSE, CAPILLARY: Glucose-Capillary: 132 mg/dL — ABNORMAL HIGH (ref 70–99)

## 2022-10-13 NOTE — Progress Notes (Signed)
PRENATAL VISIT NOTE  Subjective:  Elaine Meyer is a 32 y.o. [phone] at [redacted]w[redacted]d being seen today for ongoing prenatal care.  She is currently monitored for the following issues for this high-risk pregnancy and has Supervision of high-risk pregnancy; Obesity affecting pregnancy; Gestational diabetes mellitus (GDM) in third trimester; Pyelectasis of fetus on prenatal ultrasound; Subchorionic hematoma, antepartum; History of abnormal cervical Pap smear; and History of shoulder dystocia in prior pregnancy on their problem list.  Patient reports no complaints.  Contractions: Not present. Vag. Bleeding: None.  Movement: Present. Denies leaking of fluid.   The following portions of the patient's history were reviewed and updated as appropriate: allergies, current medications, past family history, past medical history, past social history, past surgical history and problem list.   Objective:   Vitals:   10/13/22 1333  BP: 123/73  Pulse: (!) 120  Weight: 209 lb 3.2 oz (94.9 kg)    Fetal Status: Fetal Heart Rate (bpm): 155   Movement: Present     General:  Alert, oriented and cooperative. Patient is in no acute distress.  Skin: Skin is warm and dry. No rash noted.   Cardiovascular: Normal heart rate noted  Respiratory: Normal respiratory effort, no problems with respiration noted  Abdomen: Soft, gravid, appropriate for gestational age.  Pain/Pressure: Present     Pelvic: Cervical exam deferred        Extremities: Normal range of motion.  Edema: None  Mental Status: Normal mood and affect. Normal behavior. Normal judgment and thought content.   Assessment and Plan:  Pregnancy: [id number] at [redacted]w[redacted]d 1. Pyelectasis of fetus on prenatal ultrasound Left 7.9 and Right 6.9 on 8/6 Let peds know at delivery  2. GDM Patient not checking sugars and hasn't picked up GDM supplies. I had a frank discussion with her about GDM and BG control (pt didn't have GDM in prior pregnacies) and I asked  her if she will check her sugars and if not, then to let me know so we can take that into consideration for delivery timing. She states that she will check her sugars and will pick up supplies today. CBG checked and it was 132 and she ate a regular lunch an hour ago; log sheet given to her  Patient has repeat mfm growth u/s tomorrow. On 8/6, efw 87%, 2401g, ac 92%, afi 12.6, transverse  - US Fetal BPP W/O Non Stress; Future  3. [redacted] weeks gestation of pregnancy GBS neg  4. History of shoulder dystocia in prior pregnancy Patient had an SD with her 2022 SVD at 41wk post dates IOL. Baby was 4310gm and SD relieved with McRoberts, SP pressure and "rotation" (presumably woods screw and/or Rubin. She states that baby is doing fine and never had any issues and that the patient doesn't really remember that she had an SD b/c it didn't seem to take a long time to relieve.  Prior to this she had a 2015 3855gm 40wks SVD (no GDM) after presenting in active labor, and she had a 2013 3500gm SVD (no GDM) after a 41w post dates IOL  Baby was 2400gm, with borderline LGA and a large AC on 8/6 and I told her that, presumably, the baby will be big at her u/s tomorrow. I told her that she definitely wouldn't be going past her EDC and depending on her CBG control and/or bpp today and/or her growth u/s tomorrow that she may be delivered sometime soon.   I told her that there's no  way to predict if she will have another SD but her risks are certainly high and maternal/fetal risks with an SD were d/w her  Will see her back on Friday to talk more  5. History of abnormal cervical Pap smear Needs pap PP  6. Questionable oligohydramnios U/s for presentation done today and baby is cephalic but fluid looks low using the ipad u/s. Patient added on for a BPP.   7. BMI 36.0-36.9,adult Weight stable  8. Supervision of high risk pregnancy in third trimester vasectomy  Preterm labor symptoms and general obstetric precautions  including but not limited to vaginal bleeding, contractions, leaking of fluid and fetal movement were reviewed in detail with the patient. Please refer to After Visit Summary for other counseling recommendations.   Return in about 2 days (around 10/15/2022) for hrob. okay to overbook.  Future Appointments  Date Time Provider Department Center  10/13/2022  4:15 PM [facility] US2 [facility]  10/14/2022  3:00 PM [facility] US1 [facility]  10/14/2022  4:00 PM [facility] PROVIDER 1 [facility]  10/15/2022  8:55 AM Parker Bing, MD [facility]  10/19/2022  1:15 PM [facility]  10/21/2022  2:35 PM Adam Phenix, MD [facility]  11/01/2022  8:15 AM [place] Bing, MD [facility]    [place] Bing, MD

## 2022-10-14 ENCOUNTER — Ambulatory Visit: Payer: Self-pay | Attending: Maternal & Fetal Medicine

## 2022-10-14 ENCOUNTER — Other Ambulatory Visit: Payer: Self-pay | Admitting: Maternal & Fetal Medicine

## 2022-10-14 ENCOUNTER — Ambulatory Visit (HOSPITAL_BASED_OUTPATIENT_CLINIC_OR_DEPARTMENT_OTHER): Payer: Self-pay | Admitting: Obstetrics

## 2022-10-14 DIAGNOSIS — O09293 Supervision of pregnancy with other poor reproductive or obstetric history, third trimester: Secondary | ICD-10-CM | POA: Insufficient documentation

## 2022-10-14 DIAGNOSIS — E669 Obesity, unspecified: Secondary | ICD-10-CM

## 2022-10-14 DIAGNOSIS — O3660X0 Maternal care for excessive fetal growth, unspecified trimester, not applicable or unspecified: Secondary | ICD-10-CM | POA: Insufficient documentation

## 2022-10-14 DIAGNOSIS — O24419 Gestational diabetes mellitus in pregnancy, unspecified control: Secondary | ICD-10-CM

## 2022-10-14 DIAGNOSIS — O99213 Obesity complicating pregnancy, third trimester: Secondary | ICD-10-CM

## 2022-10-14 DIAGNOSIS — O24410 Gestational diabetes mellitus in pregnancy, diet controlled: Secondary | ICD-10-CM

## 2022-10-14 DIAGNOSIS — O35EXX0 Maternal care for other (suspected) fetal abnormality and damage, fetal genitourinary anomalies, not applicable or unspecified: Secondary | ICD-10-CM

## 2022-10-14 DIAGNOSIS — O0933 Supervision of pregnancy with insufficient antenatal care, third trimester: Secondary | ICD-10-CM | POA: Insufficient documentation

## 2022-10-14 DIAGNOSIS — Z3A37 37 weeks gestation of pregnancy: Secondary | ICD-10-CM | POA: Insufficient documentation

## 2022-10-14 LAB — CERVICOVAGINAL ANCILLARY ONLY
Bacterial Vaginitis (gardnerella): NEGATIVE
Candida Glabrata: NEGATIVE
Candida Vaginitis: NEGATIVE
Chlamydia: NEGATIVE
Comment: NEGATIVE
Comment: NEGATIVE
Comment: NEGATIVE
Comment: NEGATIVE
Comment: NEGATIVE
Comment: NORMAL
Neisseria Gonorrhea: NEGATIVE
Trichomonas: NEGATIVE

## 2022-10-14 NOTE — Progress Notes (Signed)
MFM Note  Elaine Meyer is currently at 37 weeks.  She was seen due to recently diagnosed diet-controlled gestational diabetes.  The patient just picked up her testing supplies recently and has only obtained 1 or 2 fingerstick measurements.    A shoulder dystocia was encountered during her last delivery in October 2022 when she delivered a 9 pound 8 ounce baby at 41 weeks and 2 days.  The patient reports that her baby did not experience any adverse sequelae related to the shoulder dystocia and is doing well today.  She has 2 other uncomplicated vaginal deliveries of babies weighing 7 and a half and 8 and half pounds at term.  The overall EFW of 7 pounds 7 ounces measures at the 82nd percentile for her gestational age.  There was normal amniotic fluid.  Mild bilateral pyelectasis measuring 0.7 cm continues to be noted.  The patient was reassured that this is most likely a normal variant at her current gestational age.    She was advised to notify her pediatricians regarding the renal pelvis dilatation that was noted on her prenatal ultrasound exams.  Her pediatrician will order additional imaging studies of the fetal kidneys after birth.  To decrease the risk of shoulder dystocia, due to potentially uncontrolled gestational diabetes, delivery is recommended at around 38 weeks.    The patient was advised that as the EFW obtained today is about 2 pounds less than her prior child with the shoulder dystocia, hopefully she will be able to have a successful and uncomplicated vaginal delivery.    She understands that the only way to avoid a shoulder dystocia is via C-section.    However, as she has had 3 prior vaginal deliveries, I would encourage her to attempt another vaginal delivery.    The patient should continue weekly fetal testing until delivery.    The patient stated that all of her questions were answered today.  A total of 30 minutes was spent counseling and coordinating the  care for this patient.  Greater than 50% of the time was spent in direct face-to-face contact.

## 2022-10-15 ENCOUNTER — Other Ambulatory Visit: Payer: Self-pay | Admitting: Advanced Practice Midwife

## 2022-10-15 ENCOUNTER — Ambulatory Visit (INDEPENDENT_AMBULATORY_CARE_PROVIDER_SITE_OTHER): Payer: Self-pay | Admitting: Obstetrics and Gynecology

## 2022-10-15 ENCOUNTER — Other Ambulatory Visit: Payer: Self-pay

## 2022-10-15 VITALS — BP 109/57 | HR 75 | Wt 210.0 lb

## 2022-10-15 DIAGNOSIS — O99213 Obesity complicating pregnancy, third trimester: Secondary | ICD-10-CM

## 2022-10-15 DIAGNOSIS — Z8759 Personal history of other complications of pregnancy, childbirth and the puerperium: Secondary | ICD-10-CM

## 2022-10-15 DIAGNOSIS — O0993 Supervision of high risk pregnancy, unspecified, third trimester: Secondary | ICD-10-CM

## 2022-10-15 DIAGNOSIS — O24419 Gestational diabetes mellitus in pregnancy, unspecified control: Secondary | ICD-10-CM

## 2022-10-15 DIAGNOSIS — Z6836 Body mass index (BMI) 36.0-36.9, adult: Secondary | ICD-10-CM

## 2022-10-15 DIAGNOSIS — O35EXX0 Maternal care for other (suspected) fetal abnormality and damage, fetal genitourinary anomalies, not applicable or unspecified: Secondary | ICD-10-CM

## 2022-10-15 DIAGNOSIS — Z3A37 37 weeks gestation of pregnancy: Secondary | ICD-10-CM

## 2022-10-15 MED ORDER — METFORMIN HCL 500 MG PO TABS
500.0000 mg | ORAL_TABLET | Freq: Two times a day (BID) | ORAL | 0 refills | Status: DC
Start: 2022-10-15 — End: 2022-10-15

## 2022-10-15 MED ORDER — METFORMIN HCL 500 MG PO TABS
500.0000 mg | ORAL_TABLET | Freq: Every day | ORAL | 0 refills | Status: DC
Start: 2022-10-15 — End: 2022-10-23

## 2022-10-15 NOTE — Progress Notes (Signed)
    PRENATAL VISIT NOTE  Subjective:  Elaine Meyer is a 32 y.o. [phone] at [redacted]w[redacted]d being seen today for ongoing prenatal care.  She is currently monitored for the following issues for this high-risk pregnancy and has Supervision of high-risk pregnancy; Obesity affecting pregnancy; Gestational diabetes mellitus (GDM) in third trimester; Pyelectasis of fetus on prenatal ultrasound; Subchorionic hematoma, antepartum; History of abnormal cervical Pap smear; and History of shoulder dystocia in prior pregnancy on their problem list.  Patient reports no complaints.  Contractions: Irritability. Vag. Bleeding: None.  Movement: Present. Denies leaking of fluid.   The following portions of the patient's history were reviewed and updated as appropriate: allergies, current medications, past family history, past medical history, past social history, past surgical history and problem list.   Objective:   Vitals:   10/15/22 0902  BP: (!) 109/57  Pulse: 75  Weight: 210 lb (95.3 kg)    Fetal Status: Fetal Heart Rate (bpm): 146   Movement: Present     General:  Alert, oriented and cooperative. Patient is in no acute distress.  Skin: Skin is warm and dry. No rash noted.   Cardiovascular: Normal heart rate noted  Respiratory: Normal respiratory effort, no problems with respiration noted  Abdomen: Soft, gravid, appropriate for gestational age.  Pain/Pressure: Present     Pelvic: Cervical exam deferred        Extremities: Normal range of motion.  Edema: None  Mental Status: Normal mood and affect. Normal behavior. Normal judgment and thought content.   Assessment and Plan:  Pregnancy: [id number] at [redacted]w[redacted]d 1. Gestational diabetes mellitus (GDM) in third trimester, gestational diabetes method of control unspecified AM fasting today 94. Yesterday CBGs were AM fasting 93, 2h PP breakfast 141, 2h PP lunch 142 and 2h PP dinner (didn't eat much) 97. Growth u/s yesterday: 82%, 3383gm, ac 97%, afi 16.5,  cephalic  I told her I would recommend starting metformin 500 with breakfast and agree with MFM re: 38wk IOL which she is amenable to. Patient set up for 9/12 AM IOL.   Repeat BPP next week  2. [redacted] weeks gestation of pregnancy  3. Pyelectasis of fetus on prenatal ultrasound Let peds know at delivery  4. Obesity affecting pregnancy in third trimester, unspecified obesity type  5. BMI 36.0-36.9,adult  6. History of shoulder dystocia in prior pregnancy D/w her again today and she would like to proceed with trial of labor; see note from 9/4  7. Supervision of high risk pregnancy in third trimester  Term labor symptoms and general obstetric precautions including but not limited to vaginal bleeding, contractions, leaking of fluid and fetal movement were reviewed in detail with the patient. Please refer to After Visit Summary for other counseling recommendations.   Return in 5 days (on 10/20/2022) for in person, md or app, in office nst/bpp.  Future Appointments  Date Time Provider Department Center  10/19/2022  1:15 PM [facility]  10/19/2022  2:35 PM Reva Bores, MD [facility]  10/20/2022  3:15 PM [facility] US2 [facility]  10/21/2022  7:00 AM [facility] SCHED ROOM [facility] None     Bing, MD

## 2022-10-16 LAB — COMPREHENSIVE METABOLIC PANEL
ALT: 7 IU/L (ref 0–32)
AST: 14 IU/L (ref 0–40)
Albumin: 3.8 g/dL — ABNORMAL LOW (ref 3.9–4.9)
Alkaline Phosphatase: 163 IU/L — ABNORMAL HIGH (ref 44–121)
BUN/Creatinine Ratio: 18 (ref 9–23)
BUN: 10 mg/dL (ref 6–20)
Bilirubin Total: <0.2 mg/dL (ref 0.0–1.2)
CO2: 17 mmol/L — ABNORMAL LOW (ref 20–29)
Calcium: 9.2 mg/dL (ref 8.7–10.2)
Chloride: 105 mmol/L (ref 96–106)
Creatinine, Ser: 0.55 mg/dL — ABNORMAL LOW (ref 0.57–1.00)
Globulin, Total: 2.1 g/dL (ref 1.5–4.5)
Glucose: 101 mg/dL — ABNORMAL HIGH (ref 70–99)
Potassium: 4 mmol/L (ref 3.5–5.2)
Sodium: 138 mmol/L (ref 134–144)
Total Protein: 5.9 g/dL — ABNORMAL LOW (ref 6.0–8.5)
eGFR: 126 mL/min/{1.73_m2} (ref >59)

## 2022-10-18 ENCOUNTER — Telehealth (HOSPITAL_COMMUNITY): Payer: Self-pay | Admitting: *Deleted

## 2022-10-18 NOTE — Telephone Encounter (Signed)
Preadmission screen  

## 2022-10-19 ENCOUNTER — Encounter: Payer: Self-pay | Attending: Obstetrics and Gynecology | Admitting: Dietician

## 2022-10-19 ENCOUNTER — Telehealth (HOSPITAL_COMMUNITY): Payer: Self-pay | Admitting: *Deleted

## 2022-10-19 ENCOUNTER — Ambulatory Visit (INDEPENDENT_AMBULATORY_CARE_PROVIDER_SITE_OTHER): Payer: Self-pay | Admitting: Family Medicine

## 2022-10-19 ENCOUNTER — Ambulatory Visit (INDEPENDENT_AMBULATORY_CARE_PROVIDER_SITE_OTHER): Payer: Self-pay | Admitting: Dietician

## 2022-10-19 ENCOUNTER — Encounter (HOSPITAL_COMMUNITY): Payer: Self-pay

## 2022-10-19 VITALS — BP 116/71 | HR 77 | Wt 213.2 lb

## 2022-10-19 DIAGNOSIS — O24410 Gestational diabetes mellitus in pregnancy, diet controlled: Secondary | ICD-10-CM

## 2022-10-19 DIAGNOSIS — Z3A37 37 weeks gestation of pregnancy: Secondary | ICD-10-CM | POA: Insufficient documentation

## 2022-10-19 DIAGNOSIS — O24419 Gestational diabetes mellitus in pregnancy, unspecified control: Secondary | ICD-10-CM | POA: Insufficient documentation

## 2022-10-19 DIAGNOSIS — O0993 Supervision of high risk pregnancy, unspecified, third trimester: Secondary | ICD-10-CM

## 2022-10-19 DIAGNOSIS — Z8759 Personal history of other complications of pregnancy, childbirth and the puerperium: Secondary | ICD-10-CM

## 2022-10-19 NOTE — Progress Notes (Signed)
Patient was seen for Gestational Diabetes self-management on 10/19/2022  Start time 1325 and End time 1355   Patient will be induced in 2 days so focus of this appointment was on healthy lifestyle to prevent diabetes. She has made lifestyle changes after GDM diagnosis. Stopped soda, reduced juice, less pasta, bread, tortillas. She has been checking her blood glucose until the past 2 days as she ran out of lancets.  Fasting about 93  2 hours after meals 130-140   Estimated due date: 10/21/2022; [redacted]w[redacted]d  Clinical: Medications: Metformin, iron Medical History: GDM   Dietary and Lifestyle History: Patient has 3 children.  Last pregnancy was borderline for GDM. She is a stay at home mom.  Physical Activity: walks 4 times per week with her children at the park  24 hr Recall:  First Meal: cereal (raisin shredded wheat), whole milk Snack:  none Second meal:  slice of pizza Snack:  graham crackers Third meal:  ham and cheese quesadila Snack:  none Beverages:  water, occasional juice  NUTRITION INTERVENTION  Nutrition education (E-1) on the following topics:   Initial Follow-up  [x]  []  Definition of Gestational Diabetes [x]  []  Why dietary management is important in controlling blood glucose [x]  []  Effects each nutrient has on blood glucose levels [x]  []  Simple carbohydrates vs complex carbohydrates []  []  Fluid intake [x]  []  Creating a balanced meal plan []  []  Carbohydrate counting  []  []  When to check blood glucose levels []  []  Proper blood glucose monitoring techniques []  []  Effect of stress and stress reduction techniques  [x]  []  Exercise effect on blood glucose levels, appropriate exercise during pregnancy []  []  Importance of limiting caffeine and abstaining from alcohol and smoking []  []  Medications used for blood sugar control during pregnancy []  []  Hypoglycemia and rule of 15 [x]  []  Postpartum self care  Patient instructed to monitor glucose levels: FBS: 60 - ? 95 mg/dL;  2 hour: ? 782 mg/dL  Patient received handouts: Nutrition Diabetes and Pregnancy Carbohydrate Counting List  Patient will be seen for follow-up as needed.

## 2022-10-19 NOTE — Progress Notes (Unsigned)
   PRENATAL VISIT NOTE  Subjective:  Elaine Meyer is a 32 y.o. [phone] at [redacted]w[redacted]d being seen today for ongoing prenatal care.  She is currently monitored for the following issues for this high-risk pregnancy and has Supervision of high-risk pregnancy; Obesity affecting pregnancy; Gestational diabetes mellitus (GDM) in third trimester; Pyelectasis of fetus on prenatal ultrasound; Subchorionic hematoma, antepartum; History of abnormal cervical Pap smear; and History of shoulder dystocia in prior pregnancy on their problem list.  Patient reports no complaints.  Contractions: Irritability. Vag. Bleeding: None.  Movement: Present. Denies leaking of fluid.   The following portions of the patient's history were reviewed and updated as appropriate: allergies, current medications, past family history, past medical history, past social history, past surgical history and problem list.   Objective:   Vitals:   10/19/22 1502  BP: 116/71  Pulse: 77  Weight: 213 lb 3.2 oz (96.7 kg)    Fetal Status: Fetal Heart Rate (bpm): 151 Fundal Height: 36 cm Movement: Present  Presentation: Vertex  General:  Alert, oriented and cooperative. Patient is in no acute distress.  Skin: Skin is warm and dry. No rash noted.   Cardiovascular: Normal heart rate noted  Respiratory: Normal respiratory effort, no problems with respiration noted  Abdomen: Soft, gravid, appropriate for gestational age.  Pain/Pressure: Absent     Pelvic: Cervical exam deferred        Extremities: Normal range of motion.  Edema: None  Mental Status: Normal mood and affect. Normal behavior. Normal judgment and thought content.   Assessment and Plan:  Pregnancy: [id number] at [redacted]w[redacted]d 1. Diet controlled gestational diabetes mellitus (GDM) in third trimester No log--no strips/lancets - rx sent FBS 92-93 2 hour pp 130-140 On no meds Last u/s for growth @ 82%. For IOL this week  2. History of shoulder dystocia in prior pregnancy With (  lb baby, this baby is measuring smaller  3. Supervision of high risk pregnancy in third trimester   Term labor symptoms and general obstetric precautions including but not limited to vaginal bleeding, contractions, leaking of fluid and fetal movement were reviewed in detail with the patient. Please refer to After Visit Summary for other counseling recommendations.   Return in 4 weeks (on 11/16/2022) for pp check.  Future Appointments  Date Time Provider Department Center  10/20/2022  3:15 PM [facility] US2 [facility]  10/21/2022  7:00 AM [facility]-LD SCHED ROOM [facility]IN[facility] None  11/16/2022  3:15 PM Venora Maples, MD [facility]    Reva Bores, MD

## 2022-10-19 NOTE — Telephone Encounter (Signed)
Preadmission screen  

## 2022-10-20 ENCOUNTER — Other Ambulatory Visit: Payer: Self-pay | Admitting: Advanced Practice Midwife

## 2022-10-20 ENCOUNTER — Ambulatory Visit (INDEPENDENT_AMBULATORY_CARE_PROVIDER_SITE_OTHER): Payer: Self-pay

## 2022-10-20 ENCOUNTER — Other Ambulatory Visit: Payer: Self-pay

## 2022-10-20 DIAGNOSIS — O24419 Gestational diabetes mellitus in pregnancy, unspecified control: Secondary | ICD-10-CM

## 2022-10-20 DIAGNOSIS — O0933 Supervision of pregnancy with insufficient antenatal care, third trimester: Secondary | ICD-10-CM

## 2022-10-20 DIAGNOSIS — Z3A37 37 weeks gestation of pregnancy: Secondary | ICD-10-CM

## 2022-10-20 DIAGNOSIS — O3660X0 Maternal care for excessive fetal growth, unspecified trimester, not applicable or unspecified: Secondary | ICD-10-CM

## 2022-10-21 ENCOUNTER — Encounter: Payer: Self-pay | Admitting: Obstetrics & Gynecology

## 2022-10-21 ENCOUNTER — Inpatient Hospital Stay (HOSPITAL_COMMUNITY): Payer: Medicaid Other

## 2022-10-21 ENCOUNTER — Inpatient Hospital Stay (HOSPITAL_COMMUNITY)
Admission: AD | Admit: 2022-10-21 | Discharge: 2022-10-23 | DRG: 807 | Disposition: A | Discharge status: Home / Self Care | Payer: Medicaid Other | Attending: Obstetrics and Gynecology | Admitting: Obstetrics and Gynecology

## 2022-10-21 ENCOUNTER — Other Ambulatory Visit: Payer: Self-pay

## 2022-10-21 ENCOUNTER — Encounter (HOSPITAL_COMMUNITY): Payer: Self-pay | Admitting: Obstetrics and Gynecology

## 2022-10-21 DIAGNOSIS — O24424 Gestational diabetes mellitus in childbirth, insulin controlled: Secondary | ICD-10-CM | POA: Diagnosis not present

## 2022-10-21 DIAGNOSIS — O0933 Supervision of pregnancy with insufficient antenatal care, third trimester: Principal | ICD-10-CM

## 2022-10-21 DIAGNOSIS — Z3A38 38 weeks gestation of pregnancy: Secondary | ICD-10-CM | POA: Diagnosis not present

## 2022-10-21 DIAGNOSIS — Z23 Encounter for immunization: Secondary | ICD-10-CM | POA: Diagnosis not present

## 2022-10-21 DIAGNOSIS — O24429 Gestational diabetes mellitus in childbirth, unspecified control: Principal | ICD-10-CM | POA: Diagnosis present

## 2022-10-21 DIAGNOSIS — Z3A37 37 weeks gestation of pregnancy: Secondary | ICD-10-CM

## 2022-10-21 DIAGNOSIS — O24419 Gestational diabetes mellitus in pregnancy, unspecified control: Secondary | ICD-10-CM

## 2022-10-21 LAB — COMPREHENSIVE METABOLIC PANEL
ALT: 13 U/L (ref 0–44)
AST: 21 U/L (ref 15–41)
Albumin: 3 g/dL — ABNORMAL LOW (ref 3.5–5.0)
Alkaline Phosphatase: 138 U/L — ABNORMAL HIGH (ref 38–126)
Anion gap: 14 (ref 5–15)
BUN: 11 mg/dL (ref 6–20)
CO2: 18 mmol/L — ABNORMAL LOW (ref 22–32)
Calcium: 9 mg/dL (ref 8.9–10.3)
Chloride: 105 mmol/L (ref 98–111)
Creatinine, Ser: 0.55 mg/dL (ref 0.44–1.00)
GFR, Estimated: >60 mL/min (ref >60)
Glucose, Bld: 114 mg/dL — ABNORMAL HIGH (ref 70–99)
Potassium: 3.5 mmol/L (ref 3.5–5.1)
Sodium: 137 mmol/L (ref 135–145)
Total Bilirubin: 0.3 mg/dL (ref 0.3–1.2)
Total Protein: 6.1 g/dL — ABNORMAL LOW (ref 6.5–8.1)

## 2022-10-21 LAB — CBC
HCT: 33.5 % — ABNORMAL LOW (ref 36.0–46.0)
Hemoglobin: 10.8 g/dL — ABNORMAL LOW (ref 12.0–15.0)
MCH: 29.8 pg (ref 26.0–34.0)
MCHC: 32.2 g/dL (ref 30.0–36.0)
MCV: 92.3 fL (ref 80.0–100.0)
Platelets: 189 10*3/uL (ref 150–400)
RBC: 3.63 MIL/uL — ABNORMAL LOW (ref 3.87–5.11)
RDW: 15.9 % — ABNORMAL HIGH (ref 11.5–15.5)
WBC: 7.8 10*3/uL (ref 4.0–10.5)
nRBC: 0 % (ref 0.0–0.2)

## 2022-10-21 LAB — TYPE AND SCREEN
ABO/RH(D): O POS
Antibody Screen: NEGATIVE

## 2022-10-21 MED ORDER — MISOPROSTOL 50MCG HALF TABLET
50.0000 ug | ORAL_TABLET | Freq: Once | ORAL | Status: AC
  Administered 2022-10-21: 50 ug via ORAL
  Filled 2022-10-21: qty 1

## 2022-10-21 MED ORDER — FENTANYL CITRATE (PF) 100 MCG/2ML IJ SOLN: 50.0000 ug | INTRAMUSCULAR | Status: DC | PRN

## 2022-10-21 MED ORDER — TERBUTALINE SULFATE 1 MG/ML IJ SOLN: 0.2500 mg | Freq: Once | INTRAMUSCULAR | Status: DC | PRN

## 2022-10-21 MED ORDER — LACTATED RINGERS IV SOLN: INTRAVENOUS | Status: DC

## 2022-10-21 MED ORDER — ACETAMINOPHEN 325 MG PO TABS: 650.0000 mg | ORAL_TABLET | ORAL | Status: DC | PRN

## 2022-10-21 MED ORDER — ONDANSETRON HCL 4 MG/2ML IJ SOLN: 4.0000 mg | Freq: Four times a day (QID) | INTRAMUSCULAR | Status: DC | PRN

## 2022-10-21 MED ORDER — OXYTOCIN BOLUS FROM INFUSION
333.0000 mL | Freq: Once | INTRAVENOUS | Status: AC
  Administered 2022-10-22: 333 mL via INTRAVENOUS

## 2022-10-21 MED ORDER — MISOPROSTOL 25 MCG QUARTER TABLET: 25.0000 ug | ORAL_TABLET | Freq: Once | ORAL | Status: DC

## 2022-10-21 MED ORDER — LACTATED RINGERS IV SOLN: 500.0000 mL | INTRAVENOUS | Status: DC | PRN

## 2022-10-21 MED ORDER — SOD CITRATE-CITRIC ACID 500-334 MG/5ML PO SOLN: 30.0000 mL | ORAL | Status: DC | PRN

## 2022-10-21 MED ORDER — OXYTOCIN-SODIUM CHLORIDE 30-0.9 UT/500ML-% IV SOLN
2.5000 [IU]/h | INTRAVENOUS | Status: DC
  Filled 2022-10-21: qty 500

## 2022-10-21 MED ORDER — LIDOCAINE HCL (PF) 1 % IJ SOLN: 30.0000 mL | INTRAMUSCULAR | Status: DC | PRN

## 2022-10-21 NOTE — H&P (Signed)
Elaine Meyer is a 32 y.o. female [phone] with IUP at [redacted]w[redacted]d presenting for IOL for A2DM (advised to start metformin last week, never started it). [facility] at [facility]  since 28 wks, transferred to [facility] d/t GDM  Prenatal History/Complications:  SVD at term X3 SD w/9#8oz, no sequelae   Pyelectasis (7mm) A2DM, on metformin  Past Medical History: Past Medical History:  Diagnosis Date   Abnormal Pap smear    HPV   GDM (gestational diabetes mellitus)    Post term pregnancy over 40 weeks 12/01/2020   Shoulder dystocia, delivered 12/03/2020   Vaginal Pap smear, abnormal     Past Surgical History: Past Surgical History:  Procedure Laterality Date   NO PAST SURGERIES      Obstetrical History: OB History     Gravida  5   Para  3   Term  3   Preterm      AB  1   Living  3      SAB  1   IAB      Ectopic      Multiple  0   Live Births  3           Social History: Social History   Socioeconomic History   Marital status: Single    Spouse name: Not on file   Number of children: Not on file   Years of education: Not on file   Highest education level: Not on file  Occupational History   Not on file  Tobacco Use   Smoking status: Never   Smokeless tobacco: Never  Vaping Use   Vaping status: Never Used  Substance and Sexual Activity   Alcohol use: No   Drug use: No   Sexual activity: Yes    Birth control/protection: None  Other Topics Concern   Not on file  Social History Narrative   Not on file   Social Determinants of Health   Financial Resource Strain: Not on file  Food Insecurity: Not on file  Transportation Needs: Not on file  Physical Activity: Not on file  Stress: Not on file  Social Connections: Not on file    Family History: Family History  Problem Relation Age of Onset   Anemia Mother    Asthma Sister    Anemia Sister    Depression Sister    Asthma Brother    Alcohol abuse Neg Hx    Arthritis Neg Hx    Birth defects Neg  Hx    Cancer Neg Hx    COPD Neg Hx    Diabetes Neg Hx    Drug abuse Neg Hx    Early death Neg Hx    Hearing loss Neg Hx    Heart disease Neg Hx    Hyperlipidemia Neg Hx    Hypertension Neg Hx    Kidney disease Neg Hx    Learning disabilities Neg Hx    Mental illness Neg Hx    Mental retardation Neg Hx    Miscarriages / Stillbirths Neg Hx    Stroke Neg Hx    Vision loss Neg Hx    Other Neg Hx     Allergies: No Known Allergies  Medications Prior to Admission  Medication Sig Dispense Refill Last Dose   acetaminophen (TYLENOL) 325 MG tablet Take 2 tablets (650 mg total) by mouth every 4 (four) hours as needed for up to 30 doses for moderate pain. (Patient not taking: Reported on 10/19/2022) 60 tablet 0  blood glucose meter kit and supplies KIT Dispense based on patient and insurance preference. Use up to four times daily as directed. 1 each 0    Blood Glucose Monitoring Suppl DEVI 1 each by Does not apply route in the morning, at noon, and at bedtime. May substitute to any manufacturer covered by patient's insurance. 1 each 0    ferrous sulfate 324 MG TBEC Take 1 tablet (324 mg total) by mouth every other day. With food. 30 tablet 0    metFORMIN (GLUCOPHAGE) 500 MG tablet Take 1 tablet (500 mg total) by mouth daily with breakfast. (Patient not taking: Reported on 10/19/2022) 30 tablet 0         Review of Systems   Constitutional: Negative for fever and chills Eyes: Negative for visual disturbances Respiratory: Negative for shortness of breath, dyspnea Cardiovascular: Negative for chest pain or palpitations  Gastrointestinal: Negative for abdominal pain, vomiting, diarrhea and constipation.   Genitourinary: Negative for dysuria and urgency Musculoskeletal: Negative for back pain, joint pain, myalgias  Neurological: Negative for dizziness and headaches      Blood pressure 116/65, pulse 84, height 5\' 4"  (1.626 m), weight 99.6 kg, last menstrual period 01/28/2022, unknown  if currently breastfeeding. General appearance: alert, cooperative, and no distress Lungs: normal respiratory effort Heart: regular rate and rhythm Abdomen: soft, non-tender; bowel sounds normal Extremities: Homans sign is negative, no sign of DVT DTR's 2+ Presentation: cephalic Fetal monitoring  Baseline: 140 bpm, Variability: Good {> 6 bpm), Accelerations: Reactive, and Decelerations: Absent Uterine activity  None  Dilation: 1.5 Effacement (%): Thick Station: -2 Exam by:: Drenda Freeze CNM EFW 72% (7lbs 7 oz)  Prenatal labs: ABO, Rh: --/--/O POS (09/12 1955) Antibody: NEG (09/12 1955) Rubella: immune RPR: Non Reactive (07/23 1454)  HBsAg: Negative (07/02 1144)  HIV: Non Reactive (07/23 1454)  GBS: Negative/-- (08/30 1648)    NURSING  PROVIDER  Office Location [facility] Dating by LMP 01/28/22, EDD 11/04/22    Anatomy U/S Renal pyectasis, f/u in 4 weeks  Initiated care at  15wks                Language  English              LAB RESULTS   Support Person Gerardo (FOB) Genetics NIPS:             AFP:                NT/IT (FT only)     Carrier Screen Horizon:  Rhogam  O/Positive/-- (07/02 1144) A1C/GTT Early:             Third trimester: 139 3 hr GTT: failed  Flu Vaccine N/A    TDaP Vaccine Letter sent to [facility] Blood Type O/Positive/-- (07/02 1144)  Covid Vaccine  Antibody Negative (07/02 1144)    Rubella 2.01 (07/02 1144)  Feeding Plan Breast RPR Non Reactive (07/02 1144)  Contraception Vasectomy HBsAg Negative (07/02 1144)  Circumcision no HIV Non Reactive (07/02 1144)  Pediatrician  [facility]MC vs TAPM HCVAb Non Reactive (07/02 1144)  Prenatal Classes Declined, done in past      Pap  06/05/20 NILM per [facility]. Previously ASCUS + HPV 05/30/2013 and ACUS + HPV 03/28/2012. Per ASCCP guidelines colposcopy recommended postpartum.   BTLConsent  GC/CT Initial:             36wks: -/-  VBAC  Consent  GBS   For PCN allergy, check sensitivities   Aspirin indicated? No  DME Rx [ ]  BP cuff [ ]  Weight Scale  Waterbirth  [ ]  Class [ ]  Consent [ ]  CNM visit  PHQ9 & GAD7 [  ] new OB [x]  28 weeks PHQ-9 low [  ] 36 weeks Induction  [ ]  Orders Entered [ ] Foley Y/N     Prenatal Transfer Tool  Maternal Diabetes: Yes:  Diabetes Type:  Insulin/Medication controlled Genetic Screening: Declined Maternal Ultrasounds/Referrals: Fetal renal pyelectasis 7mm at 35 weeks Fetal Ultrasounds or other Referrals:  None Maternal Substance Abuse:  No Significant Maternal Medications:  None Significant Maternal Lab Results: Group B Strep negative    Results for orders placed or performed during the hospital encounter of 10/21/22 (from the past 24 hour(s))  CBC   Collection Time: 10/21/22  7:55 PM  Result Value Ref Range   WBC 7.8 4.0 - 10.5 K/uL   RBC 3.63 (L) 3.87 - 5.11 MIL/uL   Hemoglobin 10.8 (L) 12.0 - 15.0 g/dL   HCT 16.1 (L) 09.6 - 04.5 %   MCV 92.3 80.0 - 100.0 fL   MCH 29.8 26.0 - 34.0 pg   MCHC 32.2 30.0 - 36.0 g/dL   RDW 40.9 (H) 81.1 - 91.4 %   Platelets 189 150 - 400 K/uL   nRBC 0.0 0.0 - 0.2 %  Comprehensive metabolic panel   Collection Time: 10/21/22  7:55 PM  Result Value Ref Range   Sodium 137 135 - 145 mmol/L   Potassium 3.5 3.5 - 5.1 mmol/L   Chloride 105 98 - 111 mmol/L   CO2 18 (L) 22 - 32 mmol/L   Glucose, Bld 114 (H) 70 - 99 mg/dL   BUN 11 6 - 20 mg/dL   Creatinine, Ser 7.82 0.44 - 1.00 mg/dL   Calcium 9.0 8.9 - 95.6 mg/dL   Total Protein 6.1 (L) 6.5 - 8.1 g/dL   Albumin 3.0 (L) 3.5 - 5.0 g/dL   AST 21 15 - 41 U/L   ALT 13 0 - 44 U/L   Alkaline Phosphatase 138 (H) 38 - 126 U/L   Total Bilirubin 0.3 0.3 - 1.2 mg/dL   GFR, Estimated >21 >30 mL/min   Anion gap 14 5 - 15  Type and screen   Collection Time: 10/21/22  7:55 PM  Result Value Ref Range   ABO/RH(D) O POS    Antibody Screen NEG    Sample Expiration      10/24/2022,2359 Performed at [facility], [address], [place], [place] [address]     Assessment: Elaine Malana Abdelfattah is a 32 y.o. [phone]  with an IUP at [redacted]w[redacted]d presenting for IOL for A2DM.  Plan: #Labor: Cytotec PO->Foley inserted and inflated w/60cc H20->Plan AROM/pitocin when balloon falls out #Pain:  Per request #FWB Cat 1 #ID: GBS: neg  #GDM: never started metformin, will check CBGs q 4 hours  Jacklyn Shell 10/21/2022, 11:23 PM

## 2022-10-22 ENCOUNTER — Inpatient Hospital Stay (HOSPITAL_COMMUNITY): Payer: Medicaid Other | Admitting: Anesthesiology

## 2022-10-22 ENCOUNTER — Encounter (HOSPITAL_COMMUNITY): Payer: Self-pay | Admitting: Obstetrics and Gynecology

## 2022-10-22 DIAGNOSIS — Z3A38 38 weeks gestation of pregnancy: Secondary | ICD-10-CM

## 2022-10-22 DIAGNOSIS — O24424 Gestational diabetes mellitus in childbirth, insulin controlled: Secondary | ICD-10-CM | POA: Diagnosis not present

## 2022-10-22 LAB — GLUCOSE, CAPILLARY
Glucose-Capillary: 85 mg/dL (ref 70–99)
Glucose-Capillary: 92 mg/dL (ref 70–99)
Glucose-Capillary: 80 mg/dL (ref 70–99)

## 2022-10-22 LAB — RPR: RPR Ser Ql: NONREACTIVE

## 2022-10-22 MED ORDER — EPHEDRINE 5 MG/ML INJ: 10.0000 mg | INTRAVENOUS | Status: DC | PRN

## 2022-10-22 MED ORDER — COCONUT OIL OIL: 1.0000 | TOPICAL_OIL | Status: DC | PRN

## 2022-10-22 MED ORDER — PRENATAL MULTIVITAMIN CH
1.0000 | ORAL_TABLET | Freq: Every day | ORAL | Status: DC
  Administered 2022-10-23: 1 via ORAL
  Filled 2022-10-22 (×2): qty 1

## 2022-10-22 MED ORDER — BENZOCAINE-MENTHOL 20-0.5 % EX AERO: 1.0000 | INHALATION_SPRAY | CUTANEOUS | Status: DC | PRN

## 2022-10-22 MED ORDER — DIBUCAINE (PERIANAL) 1 % EX OINT: 1.0000 | TOPICAL_OINTMENT | CUTANEOUS | Status: DC | PRN

## 2022-10-22 MED ORDER — ONDANSETRON HCL 4 MG PO TABS: 4.0000 mg | ORAL_TABLET | ORAL | Status: DC | PRN

## 2022-10-22 MED ORDER — LACTATED RINGERS IV SOLN
500.0000 mL | Freq: Once | INTRAVENOUS | Status: AC
  Administered 2022-10-22: 500 mL via INTRAVENOUS

## 2022-10-22 MED ORDER — ZOLPIDEM TARTRATE 5 MG PO TABS: 5.0000 mg | ORAL_TABLET | Freq: Every evening | ORAL | Status: DC | PRN

## 2022-10-22 MED ORDER — PHENYLEPHRINE 80 MCG/ML (10ML) SYRINGE FOR IV PUSH (FOR BLOOD PRESSURE SUPPORT): 80.0000 ug | PREFILLED_SYRINGE | INTRAVENOUS | Status: DC | PRN

## 2022-10-22 MED ORDER — ACETAMINOPHEN 325 MG PO TABS: 650.0000 mg | ORAL_TABLET | ORAL | Status: DC | PRN

## 2022-10-22 MED ORDER — IBUPROFEN 600 MG PO TABS
600.0000 mg | ORAL_TABLET | Freq: Four times a day (QID) | ORAL | Status: DC
  Administered 2022-10-22 – 2022-10-23 (×4): 600 mg via ORAL
  Filled 2022-10-22 (×4): qty 1

## 2022-10-22 MED ORDER — FENTANYL-BUPIVACAINE-NACL 0.5-0.125-0.9 MG/250ML-% EP SOLN
12.0000 mL/h | EPIDURAL | Status: DC | PRN
  Administered 2022-10-22: 12 mL/h via EPIDURAL
  Filled 2022-10-22: qty 250

## 2022-10-22 MED ORDER — SENNOSIDES-DOCUSATE SODIUM 8.6-50 MG PO TABS
2.0000 | ORAL_TABLET | Freq: Every day | ORAL | Status: DC
  Administered 2022-10-23: 2 via ORAL
  Filled 2022-10-22: qty 2

## 2022-10-22 MED ORDER — ONDANSETRON HCL 4 MG/2ML IJ SOLN: 4.0000 mg | INTRAMUSCULAR | Status: DC | PRN

## 2022-10-22 MED ORDER — DIPHENHYDRAMINE HCL 25 MG PO CAPS: 25.0000 mg | ORAL_CAPSULE | Freq: Four times a day (QID) | ORAL | Status: DC | PRN

## 2022-10-22 MED ORDER — SIMETHICONE 80 MG PO CHEW: 80.0000 mg | CHEWABLE_TABLET | ORAL | Status: DC | PRN

## 2022-10-22 MED ORDER — TETANUS-DIPHTH-ACELL PERTUSSIS 5-2.5-18.5 LF-MCG/0.5 IM SUSY: 0.5000 mL | PREFILLED_SYRINGE | Freq: Once | INTRAMUSCULAR | Status: DC

## 2022-10-22 MED ORDER — DIPHENHYDRAMINE HCL 50 MG/ML IJ SOLN: 12.5000 mg | INTRAMUSCULAR | Status: DC | PRN

## 2022-10-22 MED ORDER — MISOPROSTOL 50MCG HALF TABLET
50.0000 ug | ORAL_TABLET | Freq: Once | ORAL | Status: AC
  Administered 2022-10-22: 50 ug via ORAL

## 2022-10-22 MED ORDER — WITCH HAZEL-GLYCERIN EX PADS: 1.0000 | MEDICATED_PAD | CUTANEOUS | Status: DC | PRN

## 2022-10-22 MED ORDER — LIDOCAINE-EPINEPHRINE (PF) 1.5 %-1:200000 IJ SOLN
INTRAMUSCULAR | Status: DC | PRN
  Administered 2022-10-22: 5 mL via EPIDURAL

## 2022-10-22 NOTE — Progress Notes (Signed)
VSS FHR 150, moderate variability, + accels, no decels. Mild ctx q 6-9 minutes.  Foley still in. Will give another oral cytotec.

## 2022-10-22 NOTE — Anesthesia Procedure Notes (Signed)
Epidural Patient location during procedure: OB Start time: 10/22/2022 7:24 AM End time: 10/22/2022 7:35 AM  Staffing Anesthesiologist: Atilano Median, DO Performed: anesthesiologist   Preanesthetic Checklist Completed: patient identified, IV checked, site marked, risks and benefits discussed, surgical consent, monitors and equipment checked, pre-op evaluation and timeout performed  Epidural Patient position: sitting Prep: ChloraPrep Patient monitoring: heart rate, continuous pulse ox and blood pressure Approach: midline Location: L3-L4 Injection technique: LOR saline  Needle:  Needle type: Tuohy  Needle gauge: 17 G Needle length: 9 cm Needle insertion depth: 7 cm Catheter type: closed end flexible Catheter size: 20 Guage Catheter at skin depth: 12 cm Test dose: negative and 1.5% lidocaine with Epi 1:200 K  Assessment Events: blood not aspirated, no cerebrospinal fluid, injection not painful, no injection resistance and no paresthesia  Additional Notes Patient identified. Risks/Benefits/Options discussed with patient including but not limited to bleeding, infection, nerve damage, paralysis, failed block, incomplete pain control, headache, blood pressure changes, nausea, vomiting, reactions to medications, itching and postpartum back pain. Confirmed with bedside nurse the patient's most recent platelet count. Confirmed with patient that they are not currently taking any anticoagulation, have any bleeding history or any family history of bleeding disorders. Patient expressed understanding and wished to proceed. All questions were answered. Sterile technique was used throughout the entire procedure. Please see nursing notes for vital signs. Test dose was given through epidural catheter and negative prior to continuing to dose epidural or start infusion. Warning signs of high block given to the patient including shortness of breath, tingling/numbness in hands, complete motor block,  or any concerning symptoms with instructions to call for help. Patient was given instructions on fall risk and not to get out of bed. All questions and concerns addressed with instructions to call with any issues or inadequate analgesia.    Reason for block:procedure for pain

## 2022-10-22 NOTE — Anesthesia Preprocedure Evaluation (Signed)
Anesthesia Evaluation  Patient identified by MRN, date of birth, ID band Patient awake    Reviewed: Allergy & Precautions, NPO status , Patient's Chart, lab work & pertinent test results  Airway Mallampati: I  TM Distance: >3 FB Neck ROM: Full    Dental no notable dental hx.    Pulmonary neg pulmonary ROS   Pulmonary exam normal        Cardiovascular negative cardio ROS  Rhythm:Regular Rate:Normal     Neuro/Psych negative neurological ROS  negative psych ROS   GI/Hepatic negative GI ROS, Neg liver ROS,,,  Endo/Other  diabetes    Renal/GU negative Renal ROS  negative genitourinary   Musculoskeletal   Abdominal Normal abdominal exam  (+)   Peds  Hematology Lab Results      Component                Value               Date                      WBC                      7.8                 10/21/2022                HGB                      10.8 (L)            10/21/2022                HCT                      33.5 (L)            10/21/2022                MCV                      92.3                10/21/2022                PLT                      189                 10/21/2022             Lab Results      Component                Value               Date                      NA                       137                 10/21/2022                K                        3.5  10/21/2022                CO2                      18 (L)              10/21/2022                GLUCOSE                  114 (H)             10/21/2022                BUN                      11                  10/21/2022                CREATININE               0.55                10/21/2022                CALCIUM                  9.0                 10/21/2022                EGFR                     126                 10/15/2022                GFRNONAA                 >60                 10/21/2022              Anesthesia  Other Findings   Reproductive/Obstetrics (+) Pregnancy                              Anesthesia Physical Anesthesia Plan  ASA: 2  Anesthesia Plan: Epidural   Post-op Pain Management:    Induction:   PONV Risk Score and Plan: 2 and Treatment may vary due to age or medical condition  Airway Management Planned: Natural Airway  Additional Equipment: None  Intra-op Plan:   Post-operative Plan:   Informed Consent: I have reviewed the patients History and Physical, chart, labs and discussed the procedure including the risks, benefits and alternatives for the proposed anesthesia with the patient or authorized representative who has indicated his/her understanding and acceptance.     Dental advisory given  Plan Discussed with:   Anesthesia Plan Comments:          Anesthesia Quick Evaluation

## 2022-10-22 NOTE — Progress Notes (Signed)
Patient Vitals for the past 4 hrs:  BP Temp Temp src Pulse Resp  10/22/22 0600 -- 98 F (36.7 C) Oral -- --  10/22/22 0539 -- -- -- -- 17  10/22/22 0519 98/65 -- -- 79 --   Foley came out around 0530.  Cx 4.5/thick/-2.  AROM w/clear fluid. States ctx are getting stronger, q 3-4 minutes  FHR Cat 1.  If AROM doesn't improve labor pattern in a few hours, will start pitocin.

## 2022-10-22 NOTE — Plan of Care (Signed)

## 2022-10-22 NOTE — Discharge Summary (Shared)
Postpartum Discharge Summary  Date of Service updated***     Patient Name: Elaine Meyer DOB: 1990/11/06 MRN: [id number]  Date of admission: 10/21/2022 Delivery date:10/22/2022 Delivering provider: Wyn Forster Date of discharge: 10/22/2022  Admitting diagnosis: GDM, class A2 [O24.419] Intrauterine pregnancy: [redacted]w[redacted]d     Secondary diagnosis:  Principal Problem:   GDM, class A2 Active Problems:   Late prenatal care affecting pregnancy in third trimester  Additional problems: Hx shoulder dystocia    Discharge diagnosis: Term Pregnancy Delivered and GDM A2                                              Post partum procedures:{Postpartum procedures:23558} Augmentation: AROM, Cytotec, Foley balloon Complications: {OB Labor/Delivery Complications:20784}  Hospital course: Induction of Labor With Vaginal Delivery   32 y.o. yo [id number] at [redacted]w[redacted]d was admitted to the hospital 10/21/2022 for induction of labor.  Indication for induction: A2 DM.  Patient had an labor course uncomplicated.  Membrane Rupture Time/Date: 5:55 AM,10/22/2022  Delivery Method:Vaginal, Spontaneous Operative Delivery:N/A Episiotomy: None Lacerations:  None Details of delivery can be found in separate delivery note.  Patient had a postpartum course complicated by***. Patient is discharged home 10/22/22.  Newborn Data: Birth date:10/22/2022 Birth time:11:28 AM Gender:Female Living status:Living Apgars:9 ,9  Weight:   Magnesium Sulfate received: {Mag received:30440022} BMZ received: No Rhophylac:N/A MMR:Yes T-DaP:{Tdap:23962} Flu: N/A Transfusion:{Transfusion received:30440034}  Physical exam  Vitals:   10/22/22 0932 10/22/22 1004 10/22/22 1042 10/22/22 1100  BP: (!) 93/45 109/69 116/65 (!) 115/56  Pulse: 75 74 81 88  Resp:      Temp:  98.1 F (36.7 C)    TempSrc:  Oral    SpO2:      Weight:      Height:       General: {Exam; general:21111117} Lochia: {Desc;  appropriate/inappropriate:30686::"appropriate"} Uterine Fundus: {Desc; firm/soft:30687} Incision: {Exam; incision:21111123} DVT Evaluation: {Exam; dvt:2111122} Labs: Lab Results  Component Value Date   WBC 7.8 10/21/2022   HGB 10.8 (L) 10/21/2022   HCT 33.5 (L) 10/21/2022   MCV 92.3 10/21/2022   PLT 189 10/21/2022      Latest Ref Rng & Units 10/21/2022    7:55 PM  CMP  Glucose 70 - 99 mg/dL 147   BUN 6 - 20 mg/dL 11   Creatinine 8.29 - 1.00 mg/dL 5.62   Sodium 130 - 865 mmol/L 137   Potassium 3.5 - 5.1 mmol/L 3.5   Chloride 98 - 111 mmol/L 105   CO2 22 - 32 mmol/L 18   Calcium 8.9 - 10.3 mg/dL 9.0   Total Protein 6.5 - 8.1 g/dL 6.1   Total Bilirubin 0.3 - 1.2 mg/dL 0.3   Alkaline Phos 38 - 126 U/L 138   AST 15 - 41 U/L 21   ALT 0 - 44 U/L 13    Edinburgh Score:    12/16/2020    1:35 PM  Edinburgh Postnatal Depression Scale Screening Tool  I have been able to laugh and see the funny side of things. 0  I have looked forward with enjoyment to things. 0  I have blamed myself unnecessarily when things went wrong. 0  I have been anxious or worried for no good reason. 2  I have felt scared or panicky for no good reason. 0  Things have been getting on top of me. 2  I have  been so unhappy that I have had difficulty sleeping. 0  I have felt sad or miserable. 0  I have been so unhappy that I have been crying. 0  The thought of harming myself has occurred to me. 0  Edinburgh Postnatal Depression Scale Total 4     After visit meds:  Allergies as of 10/22/2022   No Known Allergies   Med Rec must be completed prior to using this [place]***        Discharge home in stable condition Infant Feeding: Breast Infant Disposition:home with mother Discharge instruction: per After Visit Summary and Postpartum booklet. Activity: Advance as tolerated. Pelvic rest for 6 weeks.  Diet: routine diet Future Appointments: Future Appointments  Date Time Provider Department Center   11/16/2022  3:15 PM Venora Maples, MD [facility]   Follow up Visit: Message sent to [facility] 9/13  Please schedule this patient for a In person postpartum visit in 6 weeks with the following provider: Any provider. Additional Postpartum F/U:Postpartum Depression checkup and 2 hour GTT  High risk pregnancy complicated by: GDM Delivery mode:  Vaginal, Spontaneous Anticipated Birth Control:   Vasectomy   10/22/2022 Wyn Forster, MD

## 2022-10-23 LAB — CBC
HCT: 30 % — ABNORMAL LOW (ref 36.0–46.0)
Hemoglobin: 9.5 g/dL — ABNORMAL LOW (ref 12.0–15.0)
MCH: 29 pg (ref 26.0–34.0)
MCHC: 31.7 g/dL (ref 30.0–36.0)
MCV: 91.5 fL (ref 80.0–100.0)
Platelets: 168 10*3/uL (ref 150–400)
RBC: 3.28 MIL/uL — ABNORMAL LOW (ref 3.87–5.11)
RDW: 16.2 % — ABNORMAL HIGH (ref 11.5–15.5)
WBC: 10.7 10*3/uL — ABNORMAL HIGH (ref 4.0–10.5)
nRBC: 0 % (ref 0.0–0.2)

## 2022-10-23 MED ORDER — SENNOSIDES-DOCUSATE SODIUM 8.6-50 MG PO TABS: 2.0000 | ORAL_TABLET | Freq: Every day | ORAL | Status: DC

## 2022-10-23 MED ORDER — ACETAMINOPHEN 325 MG PO TABS
650.0000 mg | ORAL_TABLET | ORAL | Status: AC | PRN
Start: 2022-10-23 — End: ?

## 2022-10-23 MED ORDER — INFLUENZA VIRUS VACC SPLIT PF (FLUZONE) 0.5 ML IM SUSY
0.5000 mL | PREFILLED_SYRINGE | INTRAMUSCULAR | Status: AC
  Administered 2022-10-23: 0.5 mL via INTRAMUSCULAR
  Filled 2022-10-23: qty 0.5

## 2022-10-23 MED ORDER — PRENATAL MULTIVITAMIN CH: 1.0000 | ORAL_TABLET | Freq: Every day | ORAL | 0 refills | Status: DC

## 2022-10-23 MED ORDER — IBUPROFEN 600 MG PO TABS: 600.0000 mg | ORAL_TABLET | Freq: Four times a day (QID) | ORAL | 0 refills | Status: AC

## 2022-10-23 MED ORDER — SIMETHICONE 80 MG PO CHEW: 80.0000 mg | CHEWABLE_TABLET | ORAL | Status: DC | PRN

## 2022-10-23 MED ORDER — COCONUT OIL OIL: 1.0000 | TOPICAL_OIL | Status: DC | PRN

## 2022-10-23 NOTE — Lactation Note (Signed)
This note was copied from a baby's chart. Lactation Consultation Note Experienced BF mom stated she is mainly formula feeding until her milk comes in. Mom is putting baby to the breast some.  Mom doesn't feel she needs any assistance at this time. Mom requested hand pump. LC assembled and demonstrated. Mom encouraged to feed baby 8-12 times/24 hours and with feeding cues.  Encouraged mom if she has any questions or concerns to ask for assistance.  Patient Name: Elaine Meyer Today's Date: 10/23/2022 Age:23 hours Reason for consult: Initial assessment;Early term 37-38.6wks   Maternal Data Does the patient have breastfeeding experience prior to this delivery?: Yes How long did the patient breastfeed?: 1st child 72 months, now 51 yrs old, 2nd child 6 months, 3rd child 16 months now 3 yrs old.  Feeding Mother's Current Feeding Choice: Breast Milk and Formula Nipple Type: Slow - flow  LATCH Score       Type of Nipple: Everted at rest and after stimulation  Comfort (Breast/Nipple): Soft / non-tender         Lactation Tools Discussed/Used Tools: Pump Breast pump type: Manual Pump Education: Setup, frequency, and cleaning Reason for Pumping: mom requested  Interventions Interventions: Hand pump;LC Services brochure  Discharge    Consult Status Consult Status: Complete Date: 10/23/22    Charyl Dancer 10/23/2022, 3:24 AM

## 2022-10-23 NOTE — Anesthesia Postprocedure Evaluation (Signed)
Anesthesia Post Note  Patient: Brea Craddock  Procedure(s) Performed: AN AD HOC LABOR EPIDURAL     Patient location during evaluation: Mother Baby Anesthesia Type: Epidural Level of consciousness: awake and alert Pain management: pain level controlled Vital Signs Assessment: post-procedure vital signs reviewed and stable Respiratory status: spontaneous breathing, nonlabored ventilation and respiratory function stable Cardiovascular status: stable Postop Assessment: no headache, no backache, epidural receding and able to ambulate Anesthetic complications: no   No notable events documented.  Last Vitals:  Vitals:   10/23/22 0213 10/23/22 0556  BP: 103/68 109/61  Pulse: 63 79  Resp: 18 18  Temp: 36.5 C (!) 36.3 C  SpO2: 100% 100%    Last Pain:  Vitals:   10/23/22 0827  TempSrc:   PainSc: 1    Pain Goal:                Epidural/Spinal Function Cutaneous sensation: Normal sensation (10/23/22 0827), Patient able to flex knees: Yes (10/23/22 0827), Patient able to lift hips off bed: Yes (10/23/22 0827), Back pain beyond tenderness at insertion site: No (10/23/22 0827), Progressively worsening motor and/or sensory loss: No (10/23/22 0827)  Najee Cowens

## 2022-10-28 ENCOUNTER — Encounter: Payer: Self-pay | Admitting: Advanced Practice Midwife

## 2022-11-01 ENCOUNTER — Encounter: Payer: Self-pay | Admitting: Obstetrics and Gynecology

## 2022-11-11 ENCOUNTER — Other Ambulatory Visit: Payer: Self-pay

## 2022-11-16 ENCOUNTER — Ambulatory Visit: Payer: Self-pay | Admitting: Family Medicine

## 2022-11-20 ENCOUNTER — Telehealth (HOSPITAL_COMMUNITY): Payer: Self-pay

## 2022-11-20 NOTE — Telephone Encounter (Signed)
11/20/2022 1602  Name: Elaine Meyer MRN: [id number] DOB: 05/01/1990  Reason for Call:  Transition of Care Hospital Discharge Call  Contact Status: Patient Contact Status: Complete  Language assistant needed: Interpreter Mode: Interpreter Not Needed        Follow-Up Questions: Do You Have Any Concerns About Your Health As You Heal From Delivery?: No Do You Have Any Concerns About Your Infants Health?: No  Edinburgh Postnatal Depression Scale:  In the Past 7 Days: I have been able to laugh and see the funny side of things.: As much as I always could I have looked forward with enjoyment to things.: As much as I ever did I have blamed myself unnecessarily when things went wrong.: No, never I have been anxious or worried for no good reason.: No, not at all I have felt scared or panicky for no good reason.: No, not at all Things have been getting on top of me.: No, most of the time I have coped quite well I have been so unhappy that I have had difficulty sleeping.: Not at all I have felt sad or miserable.: No, not at all I have been so unhappy that I have been crying.: No, never The thought of harming myself has occurred to me.: Never [name] Postnatal Depression Scale Total: 1  PHQ2-9 Depression Scale:     Discharge Follow-up: Edinburgh score requires follow up?: No Patient was advised of the following resources:: Support Group, Breastfeeding Support Group  Post-discharge interventions: Reviewed Newborn Safe Sleep Practices  Signature  Signe Colt

## 2023-05-01 ENCOUNTER — Telehealth: Payer: Self-pay | Admitting: Family

## 2023-05-01 DIAGNOSIS — R399 Unspecified symptoms and signs involving the genitourinary system: Secondary | ICD-10-CM

## 2023-05-01 MED ORDER — CEPHALEXIN 500 MG PO CAPS: 500.0000 mg | ORAL_CAPSULE | Freq: Two times a day (BID) | ORAL | 0 refills | Status: DC

## 2023-05-01 NOTE — Progress Notes (Signed)

## 2023-06-04 ENCOUNTER — Telehealth: Payer: Self-pay | Admitting: Physician Assistant

## 2023-06-04 DIAGNOSIS — R3989 Other symptoms and signs involving the genitourinary system: Secondary | ICD-10-CM

## 2023-06-04 MED ORDER — CEPHALEXIN 500 MG PO CAPS: 500.0000 mg | ORAL_CAPSULE | Freq: Two times a day (BID) | ORAL | 0 refills | Status: DC

## 2023-06-04 NOTE — Progress Notes (Signed)
 I have spent 5 minutes in review of e-visit questionnaire, review and updating patient chart, medical decision making and response to patient.   Laure Kidney, PA-C

## 2023-06-04 NOTE — Progress Notes (Signed)

## 2023-06-04 NOTE — Addendum Note (Signed)
 Addended byMarciana Settle on: 06/04/2023 10:46 AM   Modules accepted: Orders

## 2023-07-29 ENCOUNTER — Encounter (HOSPITAL_COMMUNITY): Payer: Self-pay

## 2023-07-29 ENCOUNTER — Inpatient Hospital Stay (HOSPITAL_COMMUNITY): Admission: RE | Admit: 2023-07-29 | Payer: Self-pay | Source: Ambulatory Visit

## 2023-07-29 ENCOUNTER — Ambulatory Visit (HOSPITAL_COMMUNITY)
Admission: EM | Admit: 2023-07-29 | Discharge: 2023-07-29 | Disposition: A | Discharge status: Home / Self Care | Payer: Self-pay | Attending: Emergency Medicine | Admitting: Emergency Medicine

## 2023-07-29 DIAGNOSIS — Z3202 Encounter for pregnancy test, result negative: Secondary | ICD-10-CM

## 2023-07-29 DIAGNOSIS — N3001 Acute cystitis with hematuria: Secondary | ICD-10-CM | POA: Insufficient documentation

## 2023-07-29 LAB — POCT URINALYSIS DIP (MANUAL ENTRY)
Bilirubin, UA: NEGATIVE
Glucose, UA: NEGATIVE mg/dL
Ketones, POC UA: NEGATIVE mg/dL
Nitrite, UA: POSITIVE — AB
Spec Grav, UA: >=1.03 — AB (ref 1.010–1.025)
Urobilinogen, UA: 0.2 U/dL
pH, UA: 5.5 (ref 5.0–8.0)

## 2023-07-29 LAB — POCT URINE PREGNANCY: Preg Test, Ur: NEGATIVE

## 2023-07-29 MED ORDER — NITROFURANTOIN MONOHYD MACRO 100 MG PO CAPS: 100.0000 mg | ORAL_CAPSULE | Freq: Two times a day (BID) | ORAL | 0 refills | Status: DC

## 2023-07-29 NOTE — ED Triage Notes (Signed)
 Patient c/o dysuria and urinary frequency x 1 week.  Patient states she has been taking Tylenol  for the pain.  Patient states a month ago she did a virtual visit with the same symptoms and states she was prescribed an antibiotic, but did not finish and then she was prescribed the antibiotic again and finished the second round. (Cephalexin )

## 2023-07-29 NOTE — Discharge Instructions (Signed)
 Start taking Macrobid twice daily for 5 days for urinary tract infection. Your urine culture results will come back in a few days and someone will call and adjust treatment if needed. Return here as needed.

## 2023-07-29 NOTE — ED Provider Notes (Signed)
 [facility]    CSN: [id number] Arrival date & time: 07/29/23  1702      History   Chief Complaint Chief Complaint  Patient presents with   Urinary Frequency   Dysuria    HPI Elaine Meyer is a 33 y.o. female.   Patient presents with dysuria and urinary frequency x 1 week.  Denies hematuria, abdominal pain, flank pain, fever, nausea, vomiting, abnormal vaginal discharge/bleeding.  LMP 06/25/2023.  Patient is currently breast-feeding.  Patient reports that she had 2 virtual visits 1 in March and 1 towards the end of April for UTI symptoms and was prescribed Keflex  both times.  Patient reports that she has been taking Tylenol  as needed for pain with minimal relief.  The history is provided by the patient and medical records.  Urinary Frequency  Dysuria   Past Medical History:  Diagnosis Date   Abnormal Pap smear    HPV   GDM (gestational diabetes mellitus)    Post term pregnancy over 40 weeks 12/01/2020   Shoulder dystocia, delivered 12/03/2020   Vaginal Pap smear, abnormal     Patient Active Problem List   Diagnosis Date Noted   GDM, class A2 10/21/2022   Late prenatal care affecting pregnancy in third trimester 10/21/2022   History of abnormal cervical Pap smear 10/01/2022   History of shoulder dystocia in prior pregnancy 10/01/2022   Obesity affecting pregnancy 09/10/2022   Gestational diabetes mellitus (GDM) in third trimester 09/10/2022   Pyelectasis of fetus on prenatal ultrasound 09/10/2022   Subchorionic hematoma, antepartum 09/10/2022   Supervision of high-risk pregnancy 08/17/2022    Past Surgical History:  Procedure Laterality Date   NO PAST SURGERIES      OB History     Gravida  5   Para  4   Term  4   Preterm      AB  1   Living  4      SAB  1   IAB      Ectopic      Multiple  0   Live Births  4            Home Medications    Prior to Admission medications   Medication Sig Start Date End Date  Taking? Authorizing Provider  nitrofurantoin, macrocrystal-monohydrate, (MACROBID) 100 MG capsule Take 1 capsule (100 mg total) by mouth 2 (two) times daily. 07/29/23  Yes Levora Reas A, NP  acetaminophen  (TYLENOL ) 325 MG tablet Take 2 tablets (650 mg total) by mouth every 4 (four) hours as needed for up to 30 doses for moderate pain. 10/23/22   Cashion, Colter L, MD  ibuprofen  (ADVIL ) 600 MG tablet Take 1 tablet (600 mg total) by mouth every 6 (six) hours. 10/23/22   Cashion, Colter L, MD    Family History Family History  Problem Relation Age of Onset   Anemia Mother    Asthma Sister    Anemia Sister    Depression Sister    Asthma Brother    Alcohol abuse Neg Hx    Arthritis Neg Hx    Birth defects Neg Hx    Cancer Neg Hx    COPD Neg Hx    Diabetes Neg Hx    Drug abuse Neg Hx    Early death Neg Hx    Hearing loss Neg Hx    Heart disease Neg Hx    Hyperlipidemia Neg Hx    Hypertension Neg Hx    Kidney disease  Neg Hx    Learning disabilities Neg Hx    Mental illness Neg Hx    Mental retardation Neg Hx    Miscarriages / Stillbirths Neg Hx    Stroke Neg Hx    Vision loss Neg Hx    Other Neg Hx     Social History Social History   Tobacco Use   Smoking status: Never   Smokeless tobacco: Never  Vaping Use   Vaping status: Never Used  Substance Use Topics   Alcohol use: No   Drug use: No     Allergies   Patient has no known allergies.   Review of Systems Review of Systems  Genitourinary:  Positive for dysuria and frequency.   Per HPI  Physical Exam Triage Vital Signs ED Triage Vitals [07/29/23 1718]  Encounter Vitals Group     BP 112/70     Girls Systolic BP Percentile      Girls Diastolic BP Percentile      Boys Systolic BP Percentile      Boys Diastolic BP Percentile      Pulse Rate 70     Resp 14     Temp 98.3 F (36.8 C)     Temp Source Oral     SpO2 96 %     Weight      Height      Head Circumference      Peak Flow      Pain Score 8      Pain Loc      Pain Education      Exclude from Growth Chart    No data found.  Updated Vital Signs BP 112/70 (BP Location: Right Arm)   Pulse 70   Temp 98.3 F (36.8 C) (Oral)   Resp 14   LMP 06/25/2023 (Approximate)   SpO2 96%   Visual Acuity Right Eye Distance:   Left Eye Distance:   Bilateral Distance:    Right Eye Near:   Left Eye Near:    Bilateral Near:     Physical Exam Vitals and nursing note reviewed.  Constitutional:      General: She is awake. She is not in acute distress.    Appearance: Normal appearance. She is well-developed and well-groomed. She is not ill-appearing.  Abdominal:     General: Abdomen is flat. There is no distension.     Palpations: Abdomen is soft.     Tenderness: There is no abdominal tenderness. There is no right CVA tenderness, left CVA tenderness or guarding.   Skin:    General: Skin is warm and dry.   Neurological:     Mental Status: She is alert.   Psychiatric:        Behavior: Behavior is cooperative.      UC Treatments / Results  Labs (all labs ordered are listed, but only abnormal results are displayed) Labs Reviewed  POCT URINALYSIS DIP (MANUAL ENTRY) - Abnormal; Notable for the following components:      Result Value   Color, UA light yellow (*)    Spec Grav, UA >=1.030 (*)    Blood, UA moderate (*)    Protein Ur, POC trace (*)    Nitrite, UA Positive (*)    Leukocytes, UA Trace (*)    All other components within normal limits  URINE CULTURE  POCT URINE PREGNANCY    EKG   Radiology No results found.  Procedures Procedures (including critical care time)  Medications Ordered in UC Medications -  No data to display  Initial Impression / Assessment and Plan / UC Course  I have reviewed the triage vital signs and the nursing notes.  Pertinent labs & imaging results that were available during my care of the patient were reviewed by me and considered in my medical decision making (see chart for  details).     Patient is overall well-appearing.  Vitals are stable.  No suprapubic tenderness or CVA tenderness noted.  Urinalysis revealed positive nitrates, trace leukocytes, and moderate RBCs.  Will send culture.  Empirically treating for acute cystitis with Macrobid.  Discussed follow-up and return precautions. Final Clinical Impressions(s) / UC Diagnoses   Final diagnoses:  Acute cystitis with hematuria     Discharge Instructions      Start taking Macrobid twice daily for 5 days for urinary tract infection. Your urine culture results will come back in a few days and someone will call and adjust treatment if needed. Return here as needed.     ED Prescriptions     Medication Sig Dispense Auth. Provider   nitrofurantoin, macrocrystal-monohydrate, (MACROBID) 100 MG capsule Take 1 capsule (100 mg total) by mouth 2 (two) times daily. 10 capsule Levora Reas A, NP      PDMP not reviewed this encounter.   Levora Reas A, NP 07/29/23 1755

## 2023-07-31 LAB — URINE CULTURE: Culture: >=100000 — AB

## 2023-08-01 ENCOUNTER — Ambulatory Visit (HOSPITAL_COMMUNITY): Payer: Self-pay

## 2023-10-19 ENCOUNTER — Telehealth: Payer: Self-pay | Admitting: Physician Assistant

## 2023-10-19 DIAGNOSIS — J020 Streptococcal pharyngitis: Secondary | ICD-10-CM

## 2023-10-19 MED ORDER — AMOXICILLIN 500 MG PO TABS: 500.0000 mg | ORAL_TABLET | Freq: Two times a day (BID) | ORAL | 0 refills | Status: AC

## 2023-10-19 NOTE — Progress Notes (Signed)

## 2023-10-19 NOTE — Progress Notes (Signed)
 I have spent 5 minutes in review of e-visit questionnaire, review and updating patient chart, medical decision making and response to patient.   Elsie Velma Lunger, PA-C

## 2023-10-19 NOTE — Progress Notes (Signed)
 Message sent to patient requesting further input regarding current symptoms. Awaiting patient response.
